# Patient Record
Sex: Male | Born: 1940 | Race: White | Hispanic: No | Marital: Married | Smoking: Current every day smoker
Health system: Southern US, Community
[De-identification: ages and names within clinical notes are randomized; demographics above are authoritative.]

## PROBLEM LIST (undated history)

## (undated) DIAGNOSIS — J439 Emphysema, unspecified: Secondary | ICD-10-CM

## (undated) DIAGNOSIS — E785 Hyperlipidemia, unspecified: Secondary | ICD-10-CM

## (undated) DIAGNOSIS — F329 Major depressive disorder, single episode, unspecified: Secondary | ICD-10-CM

## (undated) DIAGNOSIS — F32A Depression, unspecified: Secondary | ICD-10-CM

## (undated) DIAGNOSIS — I10 Essential (primary) hypertension: Secondary | ICD-10-CM

## (undated) HISTORY — DX: Essential (primary) hypertension: I10

## (undated) HISTORY — PX: EYE SURGERY: SHX253

## (undated) HISTORY — DX: Emphysema, unspecified: J43.9

## (undated) HISTORY — DX: Major depressive disorder, single episode, unspecified: F32.9

## (undated) HISTORY — DX: Depression, unspecified: F32.A

## (undated) HISTORY — PX: APPENDECTOMY: SHX54

## (undated) HISTORY — DX: Hyperlipidemia, unspecified: E78.5

---

## 2008-10-17 ENCOUNTER — Encounter: Admission: RE | Admit: 2008-10-17 | Discharge: 2008-10-17 | Payer: Self-pay | Admitting: Family Medicine

## 2011-04-20 ENCOUNTER — Encounter (INDEPENDENT_AMBULATORY_CARE_PROVIDER_SITE_OTHER): Payer: Self-pay | Admitting: General Surgery

## 2011-04-22 ENCOUNTER — Encounter (INDEPENDENT_AMBULATORY_CARE_PROVIDER_SITE_OTHER): Payer: Self-pay | Admitting: Surgery

## 2011-04-22 ENCOUNTER — Ambulatory Visit (INDEPENDENT_AMBULATORY_CARE_PROVIDER_SITE_OTHER): Payer: 59 | Admitting: Surgery

## 2011-04-22 VITALS — BP 142/72 | HR 68 | Ht 69.0 in | Wt 165.1 lb

## 2011-04-22 DIAGNOSIS — K4090 Unilateral inguinal hernia, without obstruction or gangrene, not specified as recurrent: Secondary | ICD-10-CM

## 2011-04-22 NOTE — Progress Notes (Signed)
Thomas Mendoza comes today for evaluation of a right inguinal hernia. His wife just had back surgery and he's been lifting her a lot and his noticed a pop. A prominent bulge was noted in the right inguinal region and he saw [name] who referred him for evaluation. On exam this is easily reducible and quite prominent but is still fairly high on the cord. I offered him repair but I think he wants to wait. He is not been hurting him and he said no symptoms from it. He is to be taking his grandson to a [employer] [facility] [employer] ([employer] in [place] in a few weeks.  I gave him a booklet on hernia repair and offered to see him back in 3 months and see how he is getting along with this. He seems aware of the risk.  Plan return 3 months

## 2011-08-11 ENCOUNTER — Encounter (INDEPENDENT_AMBULATORY_CARE_PROVIDER_SITE_OTHER): Payer: Self-pay | Admitting: Surgery

## 2011-08-11 ENCOUNTER — Ambulatory Visit (INDEPENDENT_AMBULATORY_CARE_PROVIDER_SITE_OTHER): Payer: 59 | Admitting: Surgery

## 2011-08-11 DIAGNOSIS — J449 Chronic obstructive pulmonary disease, unspecified: Secondary | ICD-10-CM | POA: Insufficient documentation

## 2011-08-11 DIAGNOSIS — K4090 Unilateral inguinal hernia, without obstruction or gangrene, not specified as recurrent: Secondary | ICD-10-CM

## 2011-08-11 DIAGNOSIS — J4489 Other specified chronic obstructive pulmonary disease: Secondary | ICD-10-CM

## 2011-08-11 NOTE — Progress Notes (Signed)
Thomas Mendoza has a prominent right inguinal hernia. He worries about taking care of his wife as he has had a lot of lifting. Upon we could still fix this think he is reluctant to do so at the present time. I would need to repair this running hernia with mesh. It's very prominent been reducible. He was to followup in about 6 months. Return 6 months

## 2011-08-11 NOTE — Patient Instructions (Signed)
Return immediately to the emergency room if you develop nausea vomiting and your hernia does not go back inside. Followup with me in 6 months.

## 2012-02-17 ENCOUNTER — Encounter (INDEPENDENT_AMBULATORY_CARE_PROVIDER_SITE_OTHER): Payer: 59 | Admitting: Surgery

## 2012-02-29 ENCOUNTER — Telehealth: Payer: Self-pay

## 2012-02-29 ENCOUNTER — Other Ambulatory Visit: Payer: Self-pay | Admitting: Family Medicine

## 2012-02-29 NOTE — Telephone Encounter (Signed)
NEEDS REFILL ON BLOOD PRESSURE, CHOLESTROL AND

## 2012-02-29 NOTE — Telephone Encounter (Signed)
PT IS GOING TO CALL PHARMACY TO REQUEST REFILLS. MADE APPT FOR A CPE IN AUGUST W/DR L

## 2012-04-05 ENCOUNTER — Ambulatory Visit: Payer: Self-pay | Admitting: Family Medicine

## 2012-05-03 ENCOUNTER — Encounter: Payer: Self-pay | Admitting: Family Medicine

## 2012-05-03 ENCOUNTER — Ambulatory Visit (INDEPENDENT_AMBULATORY_CARE_PROVIDER_SITE_OTHER): Payer: 59 | Admitting: Family Medicine

## 2012-05-03 VITALS — BP 116/60 | HR 85 | Temp 98.2°F | Resp 16 | Ht 65.5 in | Wt 158.8 lb

## 2012-05-03 DIAGNOSIS — E785 Hyperlipidemia, unspecified: Secondary | ICD-10-CM

## 2012-05-03 DIAGNOSIS — Z0000 Encounter for general adult medical examination without abnormal findings: Secondary | ICD-10-CM

## 2012-05-03 DIAGNOSIS — F32A Depression, unspecified: Secondary | ICD-10-CM

## 2012-05-03 DIAGNOSIS — I10 Essential (primary) hypertension: Secondary | ICD-10-CM

## 2012-05-03 DIAGNOSIS — F329 Major depressive disorder, single episode, unspecified: Secondary | ICD-10-CM | POA: Insufficient documentation

## 2012-05-03 DIAGNOSIS — F339 Major depressive disorder, recurrent, unspecified: Secondary | ICD-10-CM

## 2012-05-03 LAB — COMPREHENSIVE METABOLIC PANEL
ALT: 14 U/L (ref 0–53)
AST: 20 U/L (ref 0–37)
Albumin: 4.4 g/dL (ref 3.5–5.2)
Alkaline Phosphatase: 53 U/L (ref 39–117)
BUN: 17 mg/dL (ref 6–23)
CO2: 22 mEq/L (ref 19–32)
Calcium: 9.6 mg/dL (ref 8.4–10.5)
Chloride: 104 mEq/L (ref 96–112)
Creat: 0.88 mg/dL (ref 0.50–1.35)
Glucose, Bld: 107 mg/dL — ABNORMAL HIGH (ref 70–99)
Potassium: 4.4 mEq/L (ref 3.5–5.3)
Sodium: 137 mEq/L (ref 135–145)
Total Bilirubin: 0.7 mg/dL (ref 0.3–1.2)
Total Protein: 7.1 g/dL (ref 6.0–8.3)

## 2012-05-03 LAB — POCT URINALYSIS DIPSTICK
Glucose, UA: NEGATIVE
Ketones, UA: NEGATIVE
Leukocytes, UA: NEGATIVE
Nitrite, UA: NEGATIVE
Protein, UA: NEGATIVE
Spec Grav, UA: 1.02
Urobilinogen, UA: 0.2
pH, UA: 5.5

## 2012-05-03 LAB — POCT UA - MICROSCOPIC ONLY
Bacteria, U Microscopic: NEGATIVE
Casts, Ur, LPF, POC: NEGATIVE
Crystals, Ur, HPF, POC: NEGATIVE
WBC, Ur, HPF, POC: NEGATIVE
Yeast, UA: NEGATIVE

## 2012-05-03 LAB — LIPID PANEL
Cholesterol: 164 mg/dL (ref 0–200)
HDL: 32 mg/dL — ABNORMAL LOW (ref >39)
LDL Cholesterol: 105 mg/dL — ABNORMAL HIGH (ref 0–99)
Total CHOL/HDL Ratio: 5.1 Ratio
Triglycerides: 133 mg/dL (ref <150)
VLDL: 27 mg/dL (ref 0–40)

## 2012-05-03 LAB — CBC
HCT: 41.8 % (ref 39.0–52.0)
Hemoglobin: 15 g/dL (ref 13.0–17.0)
MCH: 35.1 pg — ABNORMAL HIGH (ref 26.0–34.0)
MCHC: 35.9 g/dL (ref 30.0–36.0)
MCV: 97.9 fL (ref 78.0–100.0)
Platelets: 196 10*3/uL (ref 150–400)
RBC: 4.27 MIL/uL (ref 4.22–5.81)
RDW: 13.5 % (ref 11.5–15.5)
WBC: 10.3 10*3/uL (ref 4.0–10.5)

## 2012-05-03 LAB — IFOBT (OCCULT BLOOD): IFOBT: NEGATIVE

## 2012-05-03 LAB — TSH: TSH: 1.2 u[IU]/mL (ref 0.350–4.500)

## 2012-05-03 MED ORDER — SIMVASTATIN 20 MG PO TABS: 20.0000 mg | ORAL_TABLET | Freq: Every day | ORAL | Status: DC

## 2012-05-03 MED ORDER — FLUOXETINE HCL 20 MG PO TABS: 20.0000 mg | ORAL_TABLET | Freq: Every day | ORAL | Status: DC

## 2012-05-03 MED ORDER — LISINOPRIL 10 MG PO TABS: 10.0000 mg | ORAL_TABLET | Freq: Every day | ORAL | Status: DC

## 2012-05-03 NOTE — Progress Notes (Signed)
@[email or web address] @  Patient ID: Thomas Mendoza MRN: [id number], DOB: 1941-08-31 71 y.o. Date of Encounter: 05/03/2012, 10:42 AM  Primary Physician: Thomas Sidle, MD  Chief Complaint: Physical (CPE)  HPI: 71 y.o. y/o male with history noted below here for CPE.  Wife is not doing well.  She had back surgery 2 years ago and her gait is worsening.  Now Dr. Venetia Mendoza believes she has Parkinson's disease and workup is in progress.  She is also having memory issues. Thomas Mendoza is very worried about her.  He also has to provide for daughter and deaf grandson.  Depression is settling in. He has a left wrist reddened area corresponding to his watch band, and so he is not wearing the watch now.  He still smokes.  Review of Systems:  Consitutional: No fever, chills, fatigue, night sweats, lymphadenopathy, or weight changes. Eyes: No visual changes, eye redness, or discharge. ENT/Mouth: Ears: No otalgia, tinnitus, discharge. Nose: No congestion, rhinorrhea, sinus pain, or epistaxis. Throat: No sore throat, post nasal drip, or teeth pain. Cardiovascular: No CP, palpitations, diaphoresis, DOE, edema, orthopnea, PND. Respiratory: No  hemoptysis, SOB, or wheezing.  Daily cough Gastrointestinal: No anorexia, dysphagia, reflux, pain, nausea, vomiting, hematemesis, diarrhea, constipation, BRBPR, or melena.  Hernia on right never repaired because of his home obligations but is nontender and no larger. Genitourinary: No dysuria, frequency, urgency, hematuria, incontinence, nocturia, decreased urinary stream, discharge, impotence, or testicular pain/masses. Musculoskeletal: No decreased ROM, myalgias, stiffness, joint swelling, or weakness. Skin: No rash, erythema, lesion changes, pain, warmth, jaundice, or pruritis. Neurological: No headache, dizziness, syncope, seizures, tremors, memory loss, coordination problems, or paresthesias. Psychological: No  hallucinations, SI/HI.  Denies suicidal ideation Endocrine: No fatigue,  polydipsia, polyphagia, polyuria, or known diabetes. All other systems were reviewed and are otherwise negative.  Past Medical History  Diagnosis Date  . Hyperlipidemia   . Hypertension   . Depression      No past surgical history on file.  Home Meds:  Prior to Admission medications   Medication Sig Start Date End Date Taking? Authorizing Provider  aspirin 81 MG tablet Take 81 mg by mouth at bedtime.     Yes Historical Provider, MD  lisinopril (PRINIVIL,ZESTRIL) 10 MG tablet Take 1 tablet (10 mg total) by mouth daily. 02/29/12  Yes Thomas M Marte, PA-C  PARoxetine (PAXIL-CR) 37.5 MG 24 hr tablet TAKE 1 TABLET BY MOUTH AT BEDTIME 02/29/12  Yes Thomas M Marte, PA-C  pravastatin (PRAVACHOL) 20 MG tablet TAKE 1 TABLET BY MOUTH AT BEDTIME 02/29/12  Yes Thomas M Marte, PA-C  buPROPion (WELLBUTRIN XL) 150 MG 24 hr tablet Take 150 mg by mouth as needed.      Historical Provider, MD    Allergies: Not on File  History   Social History  . Marital Status: Married    Spouse Name: N/A    Number of Children: N/A  . Years of Education: N/A   Occupational History  . Not on file.   Social History Main Topics  . Smoking status: Passive Smoker -- 1.0 packs/day    Types: Cigarettes  . Smokeless tobacco: Not on file  . Alcohol Use: No  . Drug Use: No  . Sexually Active: Not on file   Other Topics Concern  . Not on file   Social History Narrative  . No narrative on file    No family history on file.  Physical Exam:  Blood pressure 116/60, pulse 85, temperature 98.2 F (36.8 C), temperature source Oral, resp. rate 16,  height 5' 5.5" (1.664 m), weight 158 lb 12.8 oz (72.031 kg), SpO2 93.00%.  General: Well developed, well nourished, in no acute distress. HEENT: Normocephalic, atraumatic. Conjunctiva pink, sclera non-icteric. Pupils 2 mm constricting to 1 mm, round, regular, and equally reactive to light and accomodation. EOMI. Internal auditory canal clear. TMs with good cone of light  and without pathology. Nasal mucosa pink. Nares are without discharge. No sinus tenderness. Oral mucosa pink. Dentition both upper and lower dentures. Pharynx without exudate. Mild hearing loss bilaterally  Neck: Supple. Trachea midline. No thyromegaly. Full ROM. No lymphadenopathy. Lungs: Clear to auscultation bilaterally without wheezes, rales, or rhonchi. Breathing is of normal effort and unlabored. Cardiovascular: RRR with S1 S2. No murmurs, rubs, or gallops appreciated. Distal pulses 2+ symmetrically. No carotid or abdominal bruits Abdomen: Soft, non-tender, non-distended with normoactive bowel sounds. No hepatosplenomegaly or masses. No rebound/guarding. No CVA tenderness. With large right hernia which has not increased. Rectal: No external hemorrhoids or fissures. Rectal vault without masses.  Genitourinary:  circumcised male. No penile lesions. Testes descended bilaterally, and smooth without tenderness or masses.  Musculoskeletal: Full range of motion and 5/5 strength throughout. Without swelling, atrophy, tenderness, crepitus, or warmth. Extremities without clubbing, cyanosis, or edema. Calves supple. Skin: Warm and moist without erythema, ecchymosis, wounds.  Rash left wrist band. Neuro: A+Ox3. CN II-XII grossly intact. Moves all extremities spontaneously. Full sensation throughout. Normal gait. DTR 2+ throughout upper and lower extremities. Finger to nose intact. Psych:  Responds to questions appropriately with a depressed affect.   Studies: CBC, CMET, Lipid, TSH,. Patient is depressed UA:    Assessment/Plan:  70 y.o. y/o  male here for CPE.  His living situation is very depressing to him, and stressful at that with three dependents and having to keep the house up. 1. Hyperlipidemia  simvastatin (ZOCOR) 20 MG tablet, Lipid panel  2. Hypertension  lisinopril (PRINIVIL,ZESTRIL) 10 MG tablet, CBC, Comprehensive metabolic panel, POCT UA - Microscopic Only  3. Depression  FLUoxetine  (PROZAC) 20 MG tablet, TSH  4. Health care maintenance  POCT urinalysis dipstick, IFOBT POC (occult bld, rslt in office)   I've asked Mr. Thomas Mendoza to call me in 1-2 weeks with progress report on depression. -  Signed, Thomas Sidle, MD 05/03/2012 10:42 AM

## 2012-05-04 ENCOUNTER — Other Ambulatory Visit: Payer: Self-pay | Admitting: Physician Assistant

## 2012-05-04 MED ORDER — PRAVASTATIN SODIUM 20 MG PO TABS: 20.0000 mg | ORAL_TABLET | Freq: Every day | ORAL | Status: DC

## 2012-05-04 NOTE — Progress Notes (Signed)
I have discontinued the simvastatin and written for more pravastatin that the patient was on and doing well with.

## 2012-06-24 ENCOUNTER — Other Ambulatory Visit: Payer: Self-pay | Admitting: *Deleted

## 2012-06-24 DIAGNOSIS — F329 Major depressive disorder, single episode, unspecified: Secondary | ICD-10-CM

## 2012-06-24 MED ORDER — FLUOXETINE HCL 20 MG PO TABS: 20.0000 mg | ORAL_TABLET | Freq: Every day | ORAL | Status: DC

## 2012-09-01 ENCOUNTER — Other Ambulatory Visit: Payer: Self-pay | Admitting: Family Medicine

## 2012-09-27 ENCOUNTER — Other Ambulatory Visit: Payer: Self-pay | Admitting: Physician Assistant

## 2012-12-10 ENCOUNTER — Other Ambulatory Visit: Payer: Self-pay | Admitting: Physician Assistant

## 2012-12-25 ENCOUNTER — Other Ambulatory Visit: Payer: Self-pay | Admitting: Family Medicine

## 2012-12-26 ENCOUNTER — Telehealth: Payer: Self-pay

## 2012-12-26 MED ORDER — FLUOXETINE HCL 20 MG PO TABS: 20.0000 mg | ORAL_TABLET | Freq: Every day | ORAL | Status: DC

## 2012-12-26 NOTE — Telephone Encounter (Signed)
i sent a 90 day prescription for patient

## 2012-12-26 NOTE — Telephone Encounter (Signed)
Pt called unhappy with the refill we gave him on his fluoxetine. He usually receives a 90 day supply but we only gave him 30 (his insurance will not pay for this amount). He says he is not due for an OV as he only needs to see him once a year (August)  PT [phone]

## 2012-12-26 NOTE — Telephone Encounter (Deleted)
Pt called

## 2012-12-26 NOTE — Telephone Encounter (Signed)
Patient called in and states he had a 30 day prescription called in for him but he usually gets a 90 day supply.  Please call him at [phone].

## 2012-12-27 NOTE — Telephone Encounter (Signed)
Thank you I called patient to advise.

## 2013-01-09 ENCOUNTER — Telehealth: Payer: Self-pay

## 2013-01-09 MED ORDER — LISINOPRIL 10 MG PO TABS: ORAL_TABLET | ORAL | Status: DC

## 2013-01-09 NOTE — Telephone Encounter (Signed)
Pt has a complete physical scheduled with Dr. Elbert Ewings on July 3.  He needs a refill on his lisinopril to last him until then.  Says he usually gets a 90 day supply.  [phone]

## 2013-01-09 NOTE — Telephone Encounter (Signed)
Pended, please advise

## 2013-01-09 NOTE — Telephone Encounter (Signed)
Rx sent to pharmacy. Must keep physical appointment for any additional refills

## 2013-03-07 ENCOUNTER — Encounter: Payer: Self-pay | Admitting: Family Medicine

## 2013-03-07 ENCOUNTER — Ambulatory Visit: Payer: 59

## 2013-03-07 ENCOUNTER — Ambulatory Visit (INDEPENDENT_AMBULATORY_CARE_PROVIDER_SITE_OTHER): Payer: 59 | Admitting: Family Medicine

## 2013-03-07 VITALS — BP 150/80 | HR 64 | Temp 97.5°F | Resp 16 | Ht 66.0 in | Wt 155.2 lb

## 2013-03-07 DIAGNOSIS — Z0000 Encounter for general adult medical examination without abnormal findings: Secondary | ICD-10-CM

## 2013-03-07 DIAGNOSIS — F32A Depression, unspecified: Secondary | ICD-10-CM

## 2013-03-07 DIAGNOSIS — J438 Other emphysema: Secondary | ICD-10-CM

## 2013-03-07 DIAGNOSIS — K4090 Unilateral inguinal hernia, without obstruction or gangrene, not specified as recurrent: Secondary | ICD-10-CM

## 2013-03-07 DIAGNOSIS — I10 Essential (primary) hypertension: Secondary | ICD-10-CM

## 2013-03-07 DIAGNOSIS — F329 Major depressive disorder, single episode, unspecified: Secondary | ICD-10-CM

## 2013-03-07 DIAGNOSIS — E785 Hyperlipidemia, unspecified: Secondary | ICD-10-CM

## 2013-03-07 LAB — LIPID PANEL
Cholesterol: 170 mg/dL (ref 0–200)
HDL: 33 mg/dL — ABNORMAL LOW (ref >39)
LDL Cholesterol: 107 mg/dL — ABNORMAL HIGH (ref 0–99)
Total CHOL/HDL Ratio: 5.2 Ratio
Triglycerides: 151 mg/dL — ABNORMAL HIGH (ref <150)
VLDL: 30 mg/dL (ref 0–40)

## 2013-03-07 LAB — CBC WITH DIFFERENTIAL/PLATELET
Basophils Absolute: 0 10*3/uL (ref 0.0–0.1)
Basophils Relative: 0 % (ref 0–1)
Eosinophils Absolute: 0.4 10*3/uL (ref 0.0–0.7)
Eosinophils Relative: 5 % (ref 0–5)
HCT: 40.6 % (ref 39.0–52.0)
Hemoglobin: 14.6 g/dL (ref 13.0–17.0)
Lymphocytes Relative: 26 % (ref 12–46)
Lymphs Abs: 2.2 10*3/uL (ref 0.7–4.0)
MCH: 34.5 pg — ABNORMAL HIGH (ref 26.0–34.0)
MCHC: 36 g/dL (ref 30.0–36.0)
MCV: 96 fL (ref 78.0–100.0)
Monocytes Absolute: 0.5 10*3/uL (ref 0.1–1.0)
Monocytes Relative: 6 % (ref 3–12)
Neutro Abs: 5.3 10*3/uL (ref 1.7–7.7)
Neutrophils Relative %: 63 % (ref 43–77)
Platelets: 240 10*3/uL (ref 150–400)
RBC: 4.23 MIL/uL (ref 4.22–5.81)
RDW: 13.2 % (ref 11.5–15.5)
WBC: 8.5 10*3/uL (ref 4.0–10.5)

## 2013-03-07 LAB — COMPREHENSIVE METABOLIC PANEL
ALT: 11 U/L (ref 0–53)
AST: 18 U/L (ref 0–37)
Albumin: 4.3 g/dL (ref 3.5–5.2)
Alkaline Phosphatase: 54 U/L (ref 39–117)
BUN: 14 mg/dL (ref 6–23)
CO2: 21 mEq/L (ref 19–32)
Calcium: 9.6 mg/dL (ref 8.4–10.5)
Chloride: 104 mEq/L (ref 96–112)
Creat: 0.86 mg/dL (ref 0.50–1.35)
Glucose, Bld: 104 mg/dL — ABNORMAL HIGH (ref 70–99)
Potassium: 4.4 mEq/L (ref 3.5–5.3)
Sodium: 136 mEq/L (ref 135–145)
Total Bilirubin: 0.7 mg/dL (ref 0.3–1.2)
Total Protein: 7 g/dL (ref 6.0–8.3)

## 2013-03-07 LAB — POCT URINALYSIS DIPSTICK
Bilirubin, UA: NEGATIVE
Glucose, UA: NEGATIVE
Ketones, UA: NEGATIVE
Leukocytes, UA: NEGATIVE
Nitrite, UA: NEGATIVE
Protein, UA: NEGATIVE
Spec Grav, UA: 1.01
Urobilinogen, UA: 0.2
pH, UA: 6.5

## 2013-03-07 LAB — POCT UA - MICROSCOPIC ONLY
Bacteria, U Microscopic: NEGATIVE
Casts, Ur, LPF, POC: NEGATIVE
Crystals, Ur, HPF, POC: NEGATIVE
Mucus, UA: NEGATIVE
WBC, Ur, HPF, POC: NEGATIVE
Yeast, UA: NEGATIVE

## 2013-03-07 LAB — IFOBT (OCCULT BLOOD): IFOBT: NEGATIVE

## 2013-03-07 LAB — PSA: PSA: 0.4 ng/mL (ref <=4.00)

## 2013-03-07 MED ORDER — FLUOXETINE HCL 40 MG PO CAPS: 40.0000 mg | ORAL_CAPSULE | Freq: Every day | ORAL | Status: DC

## 2013-03-07 MED ORDER — LISINOPRIL 10 MG PO TABS: 10.0000 mg | ORAL_TABLET | Freq: Every day | ORAL | Status: DC

## 2013-03-07 MED ORDER — PRAVASTATIN SODIUM 20 MG PO TABS: ORAL_TABLET | ORAL | Status: DC

## 2013-03-07 NOTE — Progress Notes (Signed)
Patient ID: Thomas Mendoza MRN: [id number], DOB: 07-Sep-1940 72 y.o. Date of Encounter: 03/07/2013, 9:06 AM  Primary Physician: Elvina Sidle, MD  Chief Complaint: Physical (CPE)  HPI: 72 y.o. y/o male with history noted below here for CPE.  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  2 children:  Son and daughter Lives with disabled wife (spine problems, unable to move well); deaf grandson Market researcher of [place]), daughter (post stroke) Married 20 year Mother in Social worker now needing health care because of CHF Grew up in [place], [place] and dad died from black lung and liver cancer. Still has hernia.  Underwent cataract surgery for $5000 an eye Colonoscopy 2009  Review of Systems: Consitutional: No fever, chills, fatigue, night sweats, lymphadenopathy, or weight changes. Eyes: No visual changes, eye redness, or discharge. ENT/Mouth: Ears: No otalgia, tinnitus, hearing loss, discharge. Nose: No congestion, rhinorrhea, sinus pain, or epistaxis. Throat: No sore throat, post nasal drip, or teeth pain. Cardiovascular: No CP, palpitations, diaphoresis, DOE, edema, orthopnea, PND. Respiratory: has emphysema with chronic cough.  Worked in [employer] and was exposed to asbestos and smokes. Gastrointestinal: No anorexia, dysphagia, reflux, pain, nausea, vomiting, hematemesis, diarrhea, constipation, BRBPR, or melena.  Hernia on right does not cause pain Genitourinary: No dysuria, frequency, urgency, hematuria, incontinence, nocturia, decreased urinary stream, discharge, impotence, or testicular pain/masses. Musculoskeletal: No decreased ROM, myalgias, stiffness, joint swelling, or weakness. Skin: No rash, erythema, lesion changes, pain, warmth, jaundice, or pruritis. Neurological: No headache, dizziness, syncope, seizures, tremors, memory loss, coordination problems, or paresthesias. Psychological: No anxiety, depression, hallucinations, SI/HI. Endocrine: No fatigue, polydipsia, polyphagia, polyuria, or known diabetes. All  other systems were reviewed and are otherwise negative.  Past Medical History  Diagnosis Date  . Hyperlipidemia   . Hypertension   . Depression      No past surgical history on file.  Home Meds:  Prior to Admission medications   Medication Sig Start Date End Date Taking? Authorizing Provider  aspirin 81 MG tablet Take 81 mg by mouth at bedtime.     Yes Historical Provider, MD  FLUoxetine (PROZAC) 20 MG tablet Take 20 mg by mouth daily. NEED REFILLS  90 DAYS 12/26/12  Yes Elvina Sidle, MD  lisinopril (PRINIVIL,ZESTRIL) 10 MG tablet TAKE 1 TABLET BY MOUTH EVERY DAY NEED REFILL  90 DAYS 01/09/13  Yes Heather M Marte, PA-C  PARoxetine (PAXIL-CR) 37.5 MG 24 hr tablet TAKE 1 TABLET BY MOUTH AT BEDTIME 02/29/12  Yes Heather M Marte, PA-C  pravastatin (PRAVACHOL) 20 MG tablet Take 20 mg by mouth daily. NEED REFILLS  90 DAYS 05/04/12  Yes Ryan M Dunn, PA-C  buPROPion (WELLBUTRIN XL) 150 MG 24 hr tablet Take 150 mg by mouth as needed.      Historical Provider, MD    Allergies: No Known Allergies  History   Social History  . Marital Status: Married    Spouse Name: N/A    Number of Children: N/A  . Years of Education: N/A   Occupational History  . Not on file.   Social History Main Topics  . Smoking status: Passive Smoke Exposure - Never Smoker -- 1.00 packs/day    Types: Cigarettes  . Smokeless tobacco: Not on file  . Alcohol Use: No  . Drug Use: No  . Sexually Active: Not on file   Other Topics Concern  . Not on file   Social History Narrative  . No narrative on file    Family History  Problem Relation Age of Onset  . COPD Father   .  Cancer Father   . Alcohol abuse Father     Physical Exam: Blood pressure 150/80, pulse 64, temperature 97.5 F (36.4 C), temperature source Oral, resp. rate 16, height 5\' 6"  (1.676 m), weight 155 lb 3.2 oz (70.398 kg), SpO2 95.00%.  General: Well developed, well nourished, in no acute distress. HEENT: Normocephalic, atraumatic.  Conjunctiva pink, sclera non-icteric. Pupils 2 mm constricting to 1 mm, round, regular, and equally reactive to light and accomodation. EOMI. Internal auditory canal clear. TMs with good cone of light and without pathology. Nasal mucosa pink. Nares are without discharge. No sinus tenderness. Oral mucosa pink. Dentition edentulous. Pharynx without exudate.   Neck: Supple. Trachea midline. No thyromegaly. Full ROM. No lymphadenopathy. Lungs: Clear to auscultation bilaterally without rales, or rhonchi. Breathing is of normal effort and unlabored.  Wheezes bilaterally which clear with cough Cardiovascular: RRR with S1 S2. No murmurs, rubs, or gallops appreciated. Distal pulses 2+ symmetrically. No carotid or abdominal bruits Abdomen: Soft, non-tender, non-distended with normoactive bowel sounds. No hepatosplenomegaly or masses. No rebound/guarding. No CVA tenderness.  Rectal: No external hemorrhoids or fissures. Rectal vault without masses.  Genitourinary:  uncircumcised male. No penile lesions. Testes descended bilaterally, and smooth without tenderness or masses. Large right inguinal hernia Musculoskeletal: Full range of motion and 5/5 strength throughout. Without swelling, atrophy, tenderness, crepitus, or warmth. Extremities without clubbing, cyanosis, or edema. Calves supple.  Bilateral varicose veins in legs. Skin: Warm and moist without erythema, ecchymosis, wounds, or rash. Neuro: A+Ox3. CN II-XII grossly intact. Moves all extremities spontaneously. Full sensation throughout. Normal gait. DTR 2+ throughout upper and lower extremities. Finger to nose intact. Psych:  Responds to questions appropriately with a normal affect.   Studies: CBC, CMET, Lipid, PSA, UA: pending [facility] reading (PRIMARY) by  Dr. Milus Glazier:  CXR. C/w emphysema   Assessment/Plan:  72 y.o. y/o  male here for CPE.  He seems to be surviving incredible amount of family stress and demands at this time. Still he is a bit depressed.  The right inguinal hernias not bothering him and he's had it good discussion with Dr. Daphine Deutscher about fixing this. He would like a shingles vaccine for which I've written a prescription. His cough is persistent and he has lost 4 pounds. He has questions about his exposure to mesothelioma in the [employer] and light to have this prescription explored. Overall though I don't think his health is changed that much in the last year. He really is a nice man is gotten a rough deal. Annual physical exam - Plan: POCT CBC, Comprehensive metabolic panel, Lipid panel, PSA, POCT urinalysis dipstick, IFOBT POC (occult bld, rslt in office), POCT UA - Microscopic Only  Hypertension - Plan: lisinopril (PRINIVIL,ZESTRIL) 10 MG tablet  Hyperlipidemia - Plan: pravastatin (PRAVACHOL) 20 MG tablet  Depression - Plan: FLUoxetine (PROZAC) 40 MG capsule   Signed, Elvina Sidle, MD 03/07/2013 9:06 AM

## 2014-02-21 ENCOUNTER — Other Ambulatory Visit: Payer: Self-pay | Admitting: Family Medicine

## 2014-03-08 ENCOUNTER — Other Ambulatory Visit: Payer: Self-pay | Admitting: Family Medicine

## 2014-05-04 ENCOUNTER — Other Ambulatory Visit: Payer: Self-pay | Admitting: Physician Assistant

## 2014-05-29 ENCOUNTER — Ambulatory Visit
Admission: RE | Admit: 2014-05-29 | Discharge: 2014-05-29 | Disposition: A | Discharge status: Home / Self Care | Payer: 59 | Source: Ambulatory Visit | Attending: Family Medicine | Admitting: Family Medicine

## 2014-05-29 ENCOUNTER — Ambulatory Visit (INDEPENDENT_AMBULATORY_CARE_PROVIDER_SITE_OTHER): Payer: 59 | Admitting: Family Medicine

## 2014-05-29 ENCOUNTER — Other Ambulatory Visit: Payer: Self-pay | Admitting: Family Medicine

## 2014-05-29 ENCOUNTER — Encounter: Payer: Self-pay | Admitting: Family Medicine

## 2014-05-29 ENCOUNTER — Ambulatory Visit (INDEPENDENT_AMBULATORY_CARE_PROVIDER_SITE_OTHER): Payer: 59

## 2014-05-29 VITALS — BP 142/68 | HR 80 | Temp 97.6°F | Resp 16 | Ht 66.5 in | Wt 143.0 lb

## 2014-05-29 DIAGNOSIS — F17200 Nicotine dependence, unspecified, uncomplicated: Secondary | ICD-10-CM

## 2014-05-29 DIAGNOSIS — R319 Hematuria, unspecified: Secondary | ICD-10-CM

## 2014-05-29 DIAGNOSIS — R634 Abnormal weight loss: Secondary | ICD-10-CM

## 2014-05-29 DIAGNOSIS — Z136 Encounter for screening for cardiovascular disorders: Secondary | ICD-10-CM

## 2014-05-29 DIAGNOSIS — D7589 Other specified diseases of blood and blood-forming organs: Secondary | ICD-10-CM

## 2014-05-29 DIAGNOSIS — Z23 Encounter for immunization: Secondary | ICD-10-CM

## 2014-05-29 DIAGNOSIS — F3289 Other specified depressive episodes: Secondary | ICD-10-CM

## 2014-05-29 DIAGNOSIS — E785 Hyperlipidemia, unspecified: Secondary | ICD-10-CM

## 2014-05-29 DIAGNOSIS — I10 Essential (primary) hypertension: Secondary | ICD-10-CM

## 2014-05-29 DIAGNOSIS — Z0000 Encounter for general adult medical examination without abnormal findings: Secondary | ICD-10-CM

## 2014-05-29 DIAGNOSIS — F329 Major depressive disorder, single episode, unspecified: Secondary | ICD-10-CM

## 2014-05-29 DIAGNOSIS — F32A Depression, unspecified: Secondary | ICD-10-CM

## 2014-05-29 LAB — LIPID PANEL
Cholesterol: 168 mg/dL (ref 0–200)
HDL: 44 mg/dL (ref >39)
LDL Cholesterol: 100 mg/dL — ABNORMAL HIGH (ref 0–99)
Total CHOL/HDL Ratio: 3.8 Ratio
Triglycerides: 119 mg/dL (ref <150)
VLDL: 24 mg/dL (ref 0–40)

## 2014-05-29 LAB — POCT URINALYSIS DIPSTICK
Bilirubin, UA: NEGATIVE
Glucose, UA: NEGATIVE
Ketones, UA: NEGATIVE
Leukocytes, UA: NEGATIVE
Nitrite, UA: NEGATIVE
Protein, UA: NEGATIVE
Spec Grav, UA: 1.01
Urobilinogen, UA: 0.2
pH, UA: 5.5

## 2014-05-29 LAB — COMPLETE METABOLIC PANEL WITH GFR
ALT: 11 U/L (ref 0–53)
AST: 18 U/L (ref 0–37)
Albumin: 4.4 g/dL (ref 3.5–5.2)
Alkaline Phosphatase: 63 U/L (ref 39–117)
BUN: 20 mg/dL (ref 6–23)
CO2: 25 mEq/L (ref 19–32)
Calcium: 9.6 mg/dL (ref 8.4–10.5)
Chloride: 101 mEq/L (ref 96–112)
Creat: 0.98 mg/dL (ref 0.50–1.35)
GFR, Est African American: 89 mL/min
GFR, Est Non African American: 77 mL/min
Glucose, Bld: 101 mg/dL — ABNORMAL HIGH (ref 70–99)
Potassium: 4.5 mEq/L (ref 3.5–5.3)
Sodium: 134 mEq/L — ABNORMAL LOW (ref 135–145)
Total Bilirubin: 0.7 mg/dL (ref 0.2–1.2)
Total Protein: 7.3 g/dL (ref 6.0–8.3)

## 2014-05-29 LAB — POCT CBC
Granulocyte percent: 6.6 %G — AB (ref 37–80)
HCT, POC: 42.3 % — AB (ref 43.5–53.7)
Hemoglobin: 14.1 g/dL (ref 14.1–18.1)
Lymph, poc: 23.5 — AB (ref 0.6–3.4)
MCH, POC: 35.1 pg — AB (ref 27–31.2)
MCHC: 33.4 g/dL (ref 31.8–35.4)
MCV: 104.9 fL — AB (ref 80–97)
MID (cbc): 0.7 (ref 0–0.9)
MPV: 6.2 fL (ref 0–99.8)
POC Granulocyte: 69.3 — AB (ref 2–6.9)
POC LYMPH PERCENT: 2.2 %L — AB (ref 10–50)
POC MID %: 0.7 %M (ref 0–12)
Platelet Count, POC: 262 10*3/uL (ref 142–424)
RBC: 4.03 M/uL — AB (ref 4.69–6.13)
RDW, POC: 13.5 %
WBC: 9.5 10*3/uL (ref 4.6–10.2)

## 2014-05-29 LAB — VITAMIN B12: Vitamin B-12: 280 pg/mL (ref 211–911)

## 2014-05-29 LAB — IFOBT (OCCULT BLOOD): IFOBT: NEGATIVE

## 2014-05-29 MED ORDER — CITALOPRAM HYDROBROMIDE 40 MG PO TABS: 40.0000 mg | ORAL_TABLET | Freq: Every day | ORAL | Status: DC

## 2014-05-29 MED ORDER — ZOSTER VACCINE LIVE 19400 UNT/0.65ML ~~LOC~~ SOLR: 0.6500 mL | Freq: Once | SUBCUTANEOUS | Status: DC

## 2014-05-29 MED ORDER — PRAVASTATIN SODIUM 20 MG PO TABS: ORAL_TABLET | ORAL | Status: DC

## 2014-05-29 MED ORDER — LISINOPRIL 10 MG PO TABS: ORAL_TABLET | ORAL | Status: DC

## 2014-05-29 NOTE — Progress Notes (Signed)
This chart was scribed for Elvina Sidle, MD by Elon Spanner, Medical Scribe. This patient was seen in room Rm 25 and the patient's care was started at 8:52 AM.  Patient ID: THEODUS RAN MRN: [id number], DOB: Sep 14, 1940, 73 y.o. Date of Encounter: 05/29/2014, 9:07 AM  Primary Physician: Elvina Sidle, MD  Chief Complaint  Patient presents with  . Annual Exam     HPI: 73 y.o. year old male with history below presents needing a complete physical exam.  Patient reports he has recently lost 25 lbs without the intention to do so.  Patient reports he is eating well and in the same quantities as he always has.  Patient denies abdominal pain, rectal bleeding.   Patient reports he sleeps well at night but feels tired during the day, requiring him to take an afternoon nap.   Patient reports he is under a significant amount of stress due to his wife's worsening illness over the past year.  Patient states his wife is having severe health problems which require her to be bed-ridden most of the day.  He states she very rarely is able to leave the house and does so primarily when she has health appointments.  He states his daughter helps him care for his wife and states he plans to avoid having to place his wife in assisted living as long as he can care for her.  Patient also reports no complications from his recent hernia repair.  Patient reports no complications from his dentures.  Patient states his last normal colonoscopy was 5 years ago.  Patient is a current smoker.   Past Medical History  Diagnosis Date  . Hyperlipidemia   . Hypertension   . Depression   . 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  Patient reports he has 4 older siblings.    Home Meds: Prior to Admission medications   Medication Sig Start Date End Date Taking? Authorizing Provider  aspirin 81 MG tablet Take 81 mg by mouth at bedtime.      Historical Provider, MD  FLUoxetine  (PROZAC) 40 MG capsule TAKE 1 CAPSULE (40 MG TOTAL) BY MOUTH DAILY.    Morrell Riddle, PA-C  lisinopril (PRINIVIL,ZESTRIL) 10 MG tablet TAKE 1 TABLET BY MOUTH EVERY DAY PATIENT DUE FOR FOLLOW UP VISIT 05/04/14   Elvina Sidle, MD  pravastatin (PRAVACHOL) 20 MG tablet TAKE 1 TABLET BY MOUTH EVERY DAY    Morrell Riddle, PA-C    Allergies: No Known Allergies  History   Social History  . Marital Status: Married    Spouse Name: N/A    Number of Children: N/A  . Years of Education: N/A   Occupational History  . Not on file.   Social History Main Topics  . Smoking status: Current Every Day Smoker -- 1.00 packs/day    Types: Cigarettes  . Smokeless tobacco: Not on file  . Alcohol Use: No  . Drug Use: No  . Sexual Activity: Yes     Comment: number of sex partners in the last 12 months  1   Other Topics Concern  . Not on file   Social History Narrative   Exercise walking     Review of Systems: Constitutional: negative for chills, fever, night sweats, +weight changes, or +fatigue  HEENT: negative for vision changes, hearing loss, congestion, rhinorrhea, ST, epistaxis, or sinus pressure Cardiovascular: negative for chest pain or palpitations Respiratory: negative  for hemoptysis, wheezing, +shortness of breath, or cough Abdominal: negative for abdominal pain, nausea, vomiting, diarrhea, or constipation Dermatological: negative for rash Neurologic: negative for headache, dizziness, or syncope All other systems reviewed and are otherwise negative with the exception to those above and in the HPI.    Physical Exam: Blood pressure 142/68, pulse 80, temperature 97.6 F (36.4 C), temperature source Oral, resp. rate 16, height 5' 6.5" (1.689 m), weight 143 lb (64.864 kg), SpO2 95.00%., Body mass index is 22.74 kg/(m^2). General: Well developed, well nourished, in no acute distress. Head: Normocephalic, atraumatic, eyes without discharge, sclera non-icteric, nares are without discharge.  Bilateral auditory canals clear, TM's are without perforation, pearly grey and translucent with reflective cone of light bilaterally. Oral cavity moist, posterior pharynx without exudate, erythema, peritonsillar abscess, or post nasal drip.  Neck: Supple. No thyromegaly. Full ROM. No lymphadenopathy. Lungs: Clear bilaterally to auscultation without wheezes, rales, or rhonchi. Breathing is unlabored. Heart: RRR with S1 S2. No murmurs, rubs, or gallops appreciated. Abdomen: Soft, non-tender, non-distended with normoactive bowel sounds. No hepatomegaly. No rebound/guarding. No obvious abdominal masses.  Pulsatile 5 cm epigastric mass.  Also, large reducible right inguinal hernia Msk:  Strength and tone normal for age. Extremities/Skin: Warm and dry. No clubbing or cyanosis. No edema. No rashes or suspicious lesions. Bilateral femoral bruit's.  Absent pedal pulses Neuro: Alert and oriented X 3. Moves all extremities spontaneously. Gait is normal. CNII-XII grossly in tact. Psych:  Responds to questions appropriately with a normal affect.  [facility] reading (PRIMARY) by  Dr. . Milus Glazier:  Hyperinflated c/w emphysema.  Labs: Results for orders placed in visit on 05/29/14  POCT CBC      Result Value Ref Range   WBC 9.5  4.6 - 10.2 K/uL   Lymph, poc 23.5 (*) 0.6 - 3.4   POC LYMPH PERCENT 2.2 (*) 10 - 50 %L   MID (cbc) 0.7  0 - 0.9   POC MID % 0.7  0 - 12 %M   POC Granulocyte 69.3 (*) 2 - 6.9   Granulocyte percent 6.6 (*) 37 - 80 %G   RBC 4.03 (*) 4.69 - 6.13 M/uL   Hemoglobin 14.1  14.1 - 18.1 g/dL   HCT, POC 60.4 (*) 54.0 - 53.7 %   MCV 104.9 (*) 80 - 97 fL   MCH, POC 35.1 (*) 27 - 31.2 pg   MCHC 33.4  31.8 - 35.4 g/dL   RDW, POC 98.1     Platelet Count, POC 262  142 - 424 K/uL   MPV 6.2  0 - 99.8 fL  IFOBT (OCCULT BLOOD)      Result Value Ref Range   IFOBT Negative    POCT URINALYSIS DIPSTICK      Result Value Ref Range   Color, UA yellow     Clarity, UA cloudy     Glucose, UA neg      Bilirubin, UA neg     Ketones, UA neg     Spec Grav, UA 1.010     Blood, UA large     pH, UA 5.5     Protein, UA neg     Urobilinogen, UA 0.2     Nitrite, UA neg     Leukocytes, UA Negative     EKG:  Low voltage, NSR  ASSESSMENT AND PLAN:  73 y.o. year old male with obvious depression and weight loss.  His wife's chronic medical needs are stressing him to the max, but he  has not taken to drink.  He is intolerant of Wellbutrin but would like to try a different antidepressant.  Does not have time for counseling.  Smoker - Plan: DG Chest 2 View, US Abdomen Complete  Loss of weight - Plan: DG Chest 2 View, IFOBT POC (occult bld, rslt in office), COMPLETE METABOLIC PANEL WITH GFR, US Abdomen Complete  Routine general medical examination at a health care facility - Plan: POCT CBC, IFOBT POC (occult bld, rslt in office), POCT urinalysis dipstick, COMPLETE METABOLIC PANEL WITH GFR, Lipid panel, EKG 12-Lead  Hematuria - Plan: US Abdomen Complete, PSA  Screening for AAA (abdominal aortic aneurysm) - Plan: US Abdomen Complete  Depression - Plan: citalopram (CELEXA) 40 MG tablet  Other and unspecified hyperlipidemia - Plan: pravastatin (PRAVACHOL) 20 MG tablet  Essential hypertension - Plan: lisinopril (PRINIVIL,ZESTRIL) 10 MG tablet  Macrocytic - Plan: Vitamin B12   Signed, Elvina Sidle, MD 05/29/2014 9:07 AM

## 2014-05-29 NOTE — Patient Instructions (Addendum)
You can go for the ultrasound today arrive at 11am at [facility]. Do NOT eat or drink anything prior to the ultrasound.

## 2014-05-30 LAB — PSA: PSA: 0.46 ng/mL (ref <=4.00)

## 2014-05-31 ENCOUNTER — Encounter: Payer: Self-pay | Admitting: *Deleted

## 2014-06-19 ENCOUNTER — Encounter: Payer: Self-pay | Admitting: *Deleted

## 2015-01-11 ENCOUNTER — Encounter: Payer: Self-pay | Admitting: Family Medicine

## 2015-01-11 ENCOUNTER — Ambulatory Visit (INDEPENDENT_AMBULATORY_CARE_PROVIDER_SITE_OTHER): Payer: 59 | Admitting: Family Medicine

## 2015-01-11 ENCOUNTER — Ambulatory Visit (INDEPENDENT_AMBULATORY_CARE_PROVIDER_SITE_OTHER): Payer: 59

## 2015-01-11 VITALS — BP 110/62 | HR 99 | Temp 97.6°F | Resp 16 | Ht 67.0 in | Wt 141.0 lb

## 2015-01-11 DIAGNOSIS — R0600 Dyspnea, unspecified: Secondary | ICD-10-CM

## 2015-01-11 DIAGNOSIS — G4700 Insomnia, unspecified: Secondary | ICD-10-CM

## 2015-01-11 DIAGNOSIS — R634 Abnormal weight loss: Secondary | ICD-10-CM

## 2015-01-11 DIAGNOSIS — Z638 Other specified problems related to primary support group: Secondary | ICD-10-CM

## 2015-01-11 DIAGNOSIS — J431 Panlobular emphysema: Secondary | ICD-10-CM

## 2015-01-11 DIAGNOSIS — J441 Chronic obstructive pulmonary disease with (acute) exacerbation: Secondary | ICD-10-CM | POA: Diagnosis not present

## 2015-01-11 DIAGNOSIS — F439 Reaction to severe stress, unspecified: Secondary | ICD-10-CM

## 2015-01-11 LAB — POCT CBC
Granulocyte percent: 72.6 %G (ref 37–80)
HCT, POC: 39.2 % — AB (ref 43.5–53.7)
Hemoglobin: 13.1 g/dL — AB (ref 14.1–18.1)
Lymph, poc: 2.4 (ref 0.6–3.4)
MCH, POC: 34.5 pg — AB (ref 27–31.2)
MCHC: 33.5 g/dL (ref 31.8–35.4)
MCV: 103 fL — AB (ref 80–97)
MID (cbc): 0.6 (ref 0–0.9)
MPV: 5.9 fL (ref 0–99.8)
POC Granulocyte: 8 — AB (ref 2–6.9)
POC LYMPH PERCENT: 21.6 %L (ref 10–50)
POC MID %: 5.8 %M (ref 0–12)
Platelet Count, POC: 358 10*3/uL (ref 142–424)
RBC: 3.8 M/uL — AB (ref 4.69–6.13)
RDW, POC: 13.6 %
WBC: 11 10*3/uL — AB (ref 4.6–10.2)

## 2015-01-11 LAB — BASIC METABOLIC PANEL WITH GFR
BUN: 21 mg/dL (ref 6–23)
CO2: 29 mEq/L (ref 19–32)
Calcium: 9.4 mg/dL (ref 8.4–10.5)
Chloride: 100 mEq/L (ref 96–112)
Creat: 1.19 mg/dL (ref 0.50–1.35)
GFR, Est African American: 70 mL/min
GFR, Est Non African American: 60 mL/min
Glucose, Bld: 120 mg/dL — ABNORMAL HIGH (ref 70–99)
Potassium: 4.7 mEq/L (ref 3.5–5.3)
Sodium: 136 mEq/L (ref 135–145)

## 2015-01-11 MED ORDER — LEVOFLOXACIN 500 MG PO TABS: 500.0000 mg | ORAL_TABLET | Freq: Every day | ORAL | Status: DC

## 2015-01-11 MED ORDER — CLONAZEPAM 1 MG PO TABS: 1.0000 mg | ORAL_TABLET | Freq: Every evening | ORAL | Status: DC | PRN

## 2015-01-11 MED ORDER — ALBUTEROL SULFATE HFA 108 (90 BASE) MCG/ACT IN AERS: 2.0000 | INHALATION_SPRAY | Freq: Four times a day (QID) | RESPIRATORY_TRACT | Status: DC | PRN

## 2015-01-11 MED ORDER — PREDNISONE 20 MG PO TABS: ORAL_TABLET | ORAL | Status: AC

## 2015-01-11 NOTE — Patient Instructions (Signed)
Onalee Huaavid, I'm concerned about your weight loss and lung function. Hopefully the antibiotic and prednisone leak in your back on your feet. At the same time I'm giving her something for sleep which she can take twice a day if you need to for your nerves. I recommend you hold off on the Celexa while you're taking the antibiotic so that there is no drug interaction.

## 2015-01-11 NOTE — Progress Notes (Addendum)
Subjective:    Patient ID: Thomas Mendoza, male    DOB: Mar 14, 1941, 74 y.o.   MRN: [id number] This chart was scribed for Elvina SidleKurt Lauenstein, MD by Littie Deedsichard Sun, Medical Scribe. This patient was seen in Room 11 and the patient's care was started at 2:27 PM.   HPI HPI Comments: Thomas Mendoza is a 74 y.o. male with a hx of COPD with chronic bronchitis who presents to the [facility] complaining of gradual onset URI symptoms that started 2 weeks ago. Patient reports having productive cough, congestion and SOB. He has been taking Mucinex for his symptoms. He has lost weight (20 lbs) which he attributes to stress. Patient denies diaphoresis, vomiting, and diarrhea. He has been eating normally. He states he has cut back on smoking some. His last CXR was in September, 2015.  Patient states things could be better at home. He lives at home with his wife, daughter and grandson. His wife has tactile hallucinations and Alzheimer's.   Review of Systems  HENT: Positive for congestion.   Respiratory: Positive for cough and shortness of breath.   Gastrointestinal: Negative for vomiting and diarrhea.       Objective:   Physical Exam CONSTITUTIONAL: Well developed/well nourished. Patient appears to be mildly short of breath. HEAD: Normocephalic/atraumatic EYES: EOM/PERRL ENMT: Mucous membranes moist NECK: supple no meningeal signs SPINE: entire spine nontender CV: S1/S2 noted, no murmurs/rubs/gallops noted LUNGS: Sonorous rhonchi, left chest. Decreased breath sounds in the right. ABDOMEN: soft, nontender, no rebound or guarding GU: no cva tenderness NEURO: Pt is awake/alert, moves all extremitiesx4 EXTREMITIES: pulses normal, full ROM SKIN: warm, color normal PSYCH: no abnormalities of mood noted  [facility] reading (PRIMARY) by  Dr. Milus GlazierLauenstein chest x-ray shows hyperinflation, left lower lobe bleb, hazy right diaphragmatic region of right lower lobe suggesting possible early  pneumonia..  Results for orders placed or performed in visit on 01/11/15  POCT CBC  Result Value Ref Range   WBC 11.0 (A) 4.6 - 10.2 K/uL   Lymph, poc 2.4 0.6 - 3.4   POC LYMPH PERCENT 21.6 10 - 50 %L   MID (cbc) 0.6 0 - 0.9   POC MID % 5.8 0 - 12 %M   POC Granulocyte 8.0 (A) 2 - 6.9   Granulocyte percent 72.6 37 - 80 %G   RBC 3.80 (A) 4.69 - 6.13 M/uL   Hemoglobin 13.1 (A) 14.1 - 18.1 g/dL   HCT, POC 40.939.2 (A) 81.143.5 - 53.7 %   MCV 103 (A) 80 - 97 fL   MCH, POC 34.5 (A) 27 - 31.2 pg   MCHC 33.5 31.8 - 35.4 g/dL   RDW, POC 91.413.6 %   Platelet Count, POC 358 142 - 424 K/uL   MPV 5.9 0 - 99.8 fL   Results for orders placed or performed in visit on 01/11/15  POCT CBC  Result Value Ref Range   WBC 11.0 (A) 4.6 - 10.2 K/uL   Lymph, poc 2.4 0.6 - 3.4   POC LYMPH PERCENT 21.6 10 - 50 %L   MID (cbc) 0.6 0 - 0.9   POC MID % 5.8 0 - 12 %M   POC Granulocyte 8.0 (A) 2 - 6.9   Granulocyte percent 72.6 37 - 80 %G   RBC 3.80 (A) 4.69 - 6.13 M/uL   Hemoglobin 13.1 (A) 14.1 - 18.1 g/dL   HCT, POC 78.239.2 (A) 95.643.5 - 53.7 %   MCV 103 (A) 80 - 97 fL  MCH, POC 34.5 (A) 27 - 31.2 pg   MCHC 33.5 31.8 - 35.4 g/dL   RDW, POC 16.113.6 %   Platelet Count, POC 358 142 - 424 K/uL   MPV 5.9 0 - 99.8 fL        Assessment & Plan:   This chart was scribed in my presence and reviewed by me personally.    ICD-9-CM ICD-10-CM   1. Dyspnea 786.09 R06.00 DG Chest 2 View     POCT CBC     BASIC METABOLIC PANEL WITH GFR     DG Chest 2 View     predniSONE (DELTASONE) 20 MG tablet     levofloxacin (LEVAQUIN) 500 MG tablet     albuterol (PROVENTIL HFA;VENTOLIN HFA) 108 (90 BASE) MCG/ACT inhaler  2. Loss of weight 783.21 R63.4 predniSONE (DELTASONE) 20 MG tablet     levofloxacin (LEVAQUIN) 500 MG tablet     albuterol (PROVENTIL HFA;VENTOLIN HFA) 108 (90 BASE) MCG/ACT inhaler  3. Insomnia 780.52 G47.00 clonazePAM (KLONOPIN) 1 MG tablet     albuterol (PROVENTIL HFA;VENTOLIN HFA) 108 (90 BASE) MCG/ACT inhaler  4.  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 COPD exacerbation 491.21 J44.1 albuterol (PROVENTIL HFA;VENTOLIN HFA) 108 (90 BASE) MCG/ACT inhaler   Recheck 5 days  Signed, Elvina SidleKurt Lauenstein, MD

## 2015-01-12 ENCOUNTER — Encounter: Payer: Self-pay | Admitting: *Deleted

## 2015-01-15 ENCOUNTER — Encounter: Payer: Self-pay | Admitting: Radiology

## 2015-02-23 ENCOUNTER — Other Ambulatory Visit: Payer: Self-pay | Admitting: Family Medicine

## 2015-04-28 ENCOUNTER — Other Ambulatory Visit: Payer: Self-pay | Admitting: Family Medicine

## 2015-05-01 ENCOUNTER — Other Ambulatory Visit: Payer: Self-pay | Admitting: Family Medicine

## 2015-05-01 NOTE — Telephone Encounter (Signed)
Pt is requesting a refill of KLONOPIN 90 day supply.

## 2015-05-01 NOTE — Telephone Encounter (Signed)
Pt is requesting a 90 day supply of klonopin. Dr L is it [facility] for [place] to call pharm and OK a 90 day fill instead of #30 w/2RFs?

## 2015-05-06 ENCOUNTER — Telehealth: Payer: Self-pay | Admitting: *Deleted

## 2015-05-06 NOTE — Telephone Encounter (Signed)
Phoned in Klonopin 

## 2015-05-14 ENCOUNTER — Other Ambulatory Visit: Payer: Self-pay | Admitting: Family Medicine

## 2015-05-16 ENCOUNTER — Other Ambulatory Visit: Payer: Self-pay | Admitting: Family Medicine

## 2015-05-16 ENCOUNTER — Ambulatory Visit (INDEPENDENT_AMBULATORY_CARE_PROVIDER_SITE_OTHER): Payer: 59 | Admitting: Family Medicine

## 2015-05-16 VITALS — BP 134/60 | HR 83 | Temp 97.6°F | Resp 17 | Ht 67.0 in | Wt 141.0 lb

## 2015-05-16 DIAGNOSIS — Z23 Encounter for immunization: Secondary | ICD-10-CM | POA: Diagnosis not present

## 2015-05-16 DIAGNOSIS — E785 Hyperlipidemia, unspecified: Secondary | ICD-10-CM | POA: Diagnosis not present

## 2015-05-16 DIAGNOSIS — F329 Major depressive disorder, single episode, unspecified: Secondary | ICD-10-CM | POA: Diagnosis not present

## 2015-05-16 DIAGNOSIS — J410 Simple chronic bronchitis: Secondary | ICD-10-CM

## 2015-05-16 DIAGNOSIS — G4700 Insomnia, unspecified: Secondary | ICD-10-CM | POA: Diagnosis not present

## 2015-05-16 DIAGNOSIS — I10 Essential (primary) hypertension: Secondary | ICD-10-CM

## 2015-05-16 DIAGNOSIS — F32A Depression, unspecified: Secondary | ICD-10-CM

## 2015-05-16 LAB — LIPID PANEL
Cholesterol: 140 mg/dL (ref 125–200)
HDL: 34 mg/dL — ABNORMAL LOW (ref >=40)
LDL Cholesterol: 79 mg/dL (ref <130)
Total CHOL/HDL Ratio: 4.1 Ratio (ref <=5.0)
Triglycerides: 137 mg/dL (ref <150)
VLDL: 27 mg/dL (ref <30)

## 2015-05-16 LAB — COMPLETE METABOLIC PANEL WITH GFR
ALT: 8 U/L — ABNORMAL LOW (ref 9–46)
AST: 15 U/L (ref 10–35)
Albumin: 3.8 g/dL (ref 3.6–5.1)
Alkaline Phosphatase: 52 U/L (ref 40–115)
BUN: 21 mg/dL (ref 7–25)
CO2: 26 mmol/L (ref 20–31)
Calcium: 9.6 mg/dL (ref 8.6–10.3)
Chloride: 101 mmol/L (ref 98–110)
Creat: 0.96 mg/dL (ref 0.70–1.18)
GFR, Est African American: >89 mL/min (ref >=60)
GFR, Est Non African American: 78 mL/min (ref >=60)
Glucose, Bld: 172 mg/dL — ABNORMAL HIGH (ref 65–99)
Potassium: 4.6 mmol/L (ref 3.5–5.3)
Sodium: 135 mmol/L (ref 135–146)
Total Bilirubin: 0.4 mg/dL (ref 0.2–1.2)
Total Protein: 6.9 g/dL (ref 6.1–8.1)

## 2015-05-16 LAB — POCT CBC
Granulocyte percent: 68.2 %G (ref 37–80)
HCT, POC: 41.3 % — AB (ref 43.5–53.7)
Hemoglobin: 13.8 g/dL — AB (ref 14.1–18.1)
Lymph, poc: 2 (ref 0.6–3.4)
MCH, POC: 34.5 pg — AB (ref 27–31.2)
MCHC: 33.4 g/dL (ref 31.8–35.4)
MCV: 103.1 fL — AB (ref 80–97)
MID (cbc): 0.7 (ref 0–0.9)
MPV: 5.7 fL (ref 0–99.8)
POC Granulocyte: 5.8 (ref 2–6.9)
POC LYMPH PERCENT: 23.7 %L (ref 10–50)
POC MID %: 8.1 %M (ref 0–12)
Platelet Count, POC: 267 10*3/uL (ref 142–424)
RBC: 4.01 M/uL — AB (ref 4.69–6.13)
RDW, POC: 13.4 %
WBC: 8.5 10*3/uL (ref 4.6–10.2)

## 2015-05-16 MED ORDER — ALBUTEROL SULFATE HFA 108 (90 BASE) MCG/ACT IN AERS
INHALATION_SPRAY | RESPIRATORY_TRACT | Status: DC
Start: 2015-05-16 — End: 2015-09-30

## 2015-05-16 MED ORDER — CITALOPRAM HYDROBROMIDE 40 MG PO TABS: ORAL_TABLET | ORAL | Status: AC

## 2015-05-16 MED ORDER — PRAVASTATIN SODIUM 20 MG PO TABS: 20.0000 mg | ORAL_TABLET | Freq: Every day | ORAL | Status: AC

## 2015-05-16 MED ORDER — CLONAZEPAM 1 MG PO TABS
ORAL_TABLET | ORAL | Status: DC
Start: 2015-05-16 — End: 2015-09-30

## 2015-05-16 MED ORDER — LISINOPRIL 10 MG PO TABS
10.0000 mg | ORAL_TABLET | Freq: Every day | ORAL | Status: AC
Start: 2015-05-16 — End: ?

## 2015-05-16 NOTE — Progress Notes (Signed)
@UMFCLOGO @  This chart was scribed for Elvina Sidle, MD by Andrew Au, ED Scribe. This patient was seen in room 10 and the patient's care was started at 2:57 PM.  Patient ID: Thomas Mendoza MRN: [id number], DOB: 08/04/1941, 74 y.o. Date of Encounter: 05/16/2015, 2:57 PM  Primary Physician: Elvina Sidle, MD  Chief Complaint:  Chief Complaint  Patient presents with  . Follow-up  . Pneumonia    HPI: 74 y.o. year old male with history below presents for a follow up of pneumonia. Pt was seen here 01/11/15 for URI symptoms and was diagnosed with dyspnea. Pt has been doing well. He is still smoking but has cut back.  Pt is still going through stress at home with his wife. His wife has tactile hallucinations and Alzheimer's and states she is not getting better. Pt also has his 27 y.o, deaf grandson living with him and well as his daughter who had a stroke at 43 y.o and is now 37.   Pt is also needing refills of all medication. Pt agrees to have flu vaccine today. He had prevnar and pneumovax.   Past Medical History  Diagnosis Date  . Hyperlipidemia   . Hypertension   . Depression   . Emphysema of lung      Home Meds: Prior to Admission medications   Medication Sig Start Date End Date Taking? Authorizing Provider  aspirin 81 MG tablet Take 81 mg by mouth at bedtime.      Historical Provider, MD  citalopram (CELEXA) 40 MG tablet TAKE 1 TABLET (40 MG TOTAL) BY MOUTH DAILY. 05/14/15   Chelle Jeffery, PA-C  clonazePAM (KLONOPIN) 1 MG tablet TAKE 1 TABLET BY MOUTH AT BEDTIME AS NEEDED FOR ANXIETY 05/02/15   Elvina Sidle, MD  lisinopril (PRINIVIL,ZESTRIL) 10 MG tablet TAKE 1 TABLET BY MOUTH EVERY DAY 05/14/15   Chelle Jeffery, PA-C  pravastatin (PRAVACHOL) 20 MG tablet TAKE 1 TABLET BY MOUTH EVERY DAY 05/14/15   Chelle Jeffery, PA-C  predniSONE (DELTASONE) 20 MG tablet Two daily with food 01/11/15   Elvina Sidle, MD  PROVENTIL HFA 108 (90 BASE) MCG/ACT inhaler INHALE 2 PUFFS INTO THE LUNGS  EVERY 6 HOURS AS NEEDED FOR WHEEZING OR SHORTNESS OF BREATH. 02/23/15   Elvina Sidle, MD  zoster vaccine live, PF, (ZOSTAVAX) 40981 UNT/0.65ML injection Inject 19,400 Units into the skin once. 05/29/14   Elvina Sidle, MD    Allergies: No Known Allergies  Social History   Social History  . Marital Status: Married    Spouse Name: N/A  . Number of Children: N/A  . Years of Education: N/A   Occupational History  . Not on file.   Social History Main Topics  . Smoking status: Current Every Day Smoker -- 1.00 packs/day    Types: Cigarettes  . Smokeless tobacco: Not on file  . Alcohol Use: No  . Drug Use: No  . Sexual Activity: Yes     Comment: number of sex partners in the last 12 months  1   Other Topics Concern  . Not on file   Social History Narrative   Exercise walking     Review of Systems: Constitutional: negative for chills, fever, night sweats, weight changes, or fatigue  HEENT: negative for vision changes, hearing loss, congestion, rhinorrhea, ST, epistaxis, or sinus pressure Cardiovascular: negative for chest pain or palpitations Respiratory: negative for hemoptysis, wheezing, shortness of breath, or cough Abdominal: negative for abdominal pain, nausea, vomiting, diarrhea, or constipation Dermatological: negative for rash Neurologic: negative  for headache, dizziness, or syncope All other systems reviewed and are otherwise negative with the exception to those above and in the HPI.   Physical Exam: Blood pressure 134/60, pulse 83, temperature 97.6 F (36.4 C), temperature source Oral, resp. rate 17, height  (1.702 m), weight 141 lb (63.957 kg), SpO2 90 %., Body mass index is 22.08 kg/(m^2). General: Well developed, well nourished, in no acute distress. Head: Normocephalic, atraumatic, eyes without discharge, sclera non-icteric, nares are without discharge. Bilateral auditory canals clear, TM's are without perforation, pearly grey and translucent with  reflective cone of light bilaterally. Oral cavity moist, posterior pharynx without exudate, erythema, peritonsillar abscess, or post nasal drip.  Neck: Supple. No thyromegaly. Full ROM. No lymphadenopathy. Lungs: Clear bilaterally to auscultation without wheezes, rales, or rhonchi. Breathing is unlabored. Heart: RRR with S1 S2. No murmurs, rubs, or gallops appreciated. Abdomen: Soft, non-tender, non-distended with normoactive bowel sounds. No hepatomegaly. No rebound/guarding. No obvious abdominal masses. Msk:  Strength and tone normal for age. Extremities/Skin: Warm and dry. No clubbing or cyanosis. No edema. No rashes or suspicious lesions. Neuro: Alert and oriented X 3. Moves all extremities spontaneously. Gait is normal. CNII-XII grossly in tact. Psych:  Responds to questions appropriately with a normal affect.    ASSESSMENT AND PLAN:  74 y.o. year old male with COPD which is stable at this time. He is under a tremendous not at stress with a wife who is completely disabled and landed on him for dressing and feeding. He also has a deaf son and a daughter who has had a stroke on her left side, both of whom are dependent on him. He never has a day off.  This chart was scribed in my presence and reviewed by me personally.    ICD-9-CM ICD-10-CM   1. Depression 311 F32.9 citalopram (CELEXA) 40 MG tablet  2. Simple chronic bronchitis 491.0 J41.0 albuterol (PROVENTIL HFA) 108 (90 BASE) MCG/ACT inhaler     POCT CBC  3. Insomnia 780.52 G47.00 clonazePAM (KLONOPIN) 1 MG tablet  4. Essential hypertension 401.9 I10 lisinopril (PRINIVIL,ZESTRIL) 10 MG tablet     POCT CBC     COMPLETE METABOLIC PANEL WITH GFR  5. Hyperlipidemia 272.4 E78.5 pravastatin (PRAVACHOL) 20 MG tablet     Lipid panel  6. Need for prophylactic vaccination and inoculation against influenza V04.81 Z23 Flu Vaccine QUAD 36+ mos IM   Signed, Elvina Sidle, MD 05/16/2015 2:57 PM

## 2015-05-16 NOTE — Patient Instructions (Signed)

## 2015-05-19 LAB — HEMOGLOBIN A1C
Hgb A1c MFr Bld: 5.9 % — ABNORMAL HIGH (ref <5.7)
Mean Plasma Glucose: 123 mg/dL — ABNORMAL HIGH (ref <117)

## 2015-06-02 ENCOUNTER — Encounter: Payer: 59 | Admitting: Physician Assistant

## 2015-09-30 ENCOUNTER — Ambulatory Visit (INDEPENDENT_AMBULATORY_CARE_PROVIDER_SITE_OTHER): Payer: 59 | Admitting: Family Medicine

## 2015-09-30 VITALS — BP 130/68 | HR 80 | Temp 97.7°F | Resp 20 | Ht 67.0 in | Wt 143.4 lb

## 2015-09-30 DIAGNOSIS — G4700 Insomnia, unspecified: Secondary | ICD-10-CM

## 2015-09-30 DIAGNOSIS — J441 Chronic obstructive pulmonary disease with (acute) exacerbation: Secondary | ICD-10-CM | POA: Diagnosis not present

## 2015-09-30 MED ORDER — TIOTROPIUM BROMIDE MONOHYDRATE 18 MCG IN CAPS: 18.0000 ug | ORAL_CAPSULE | Freq: Two times a day (BID) | RESPIRATORY_TRACT | Status: AC

## 2015-09-30 MED ORDER — CLONAZEPAM 1 MG PO TABS: ORAL_TABLET | ORAL | Status: AC

## 2015-09-30 NOTE — Patient Instructions (Signed)
Have pharmacist call me if inhalers too expensive.

## 2015-09-30 NOTE — Progress Notes (Signed)
This is 89 year oldretired man with COPD and great stress secondary to his wife's Alzheimer's and tactile hallucinations. He comes in for refill on his medications. He continues to smoke. Says that the clonazepam is working really well for him.  He says that the insurance that he  Has does not cover albuterol inhalers.   Objective:BP 130/68 mmHg  Pulse 80  Temp(Src) 97.7 F (36.5 C) (Oral)  Resp 20  Ht  (1.702 m)  Wt 143 lb 6.4 oz (65.046 kg)  BMI 22.45 kg/m2  SpO2 93%  HEENT: Poor dental hygiene   chest: Diffuse rhonchi bibasilar rales. Patient has purse type expiratory breathing and has a barrel chest. Heart: Sounds are distant , regular, without murmur Extremities: No edema  Skin: no rash   Assessment: COPD and significant stress. No significant change   Plan: Refill medications, although we will change from albuterol to Spiriva Elvina Sidle M.D.

## 2016-03-30 ENCOUNTER — Encounter (HOSPITAL_COMMUNITY): Payer: Self-pay | Admitting: Emergency Medicine

## 2016-03-30 ENCOUNTER — Emergency Department (HOSPITAL_COMMUNITY): Payer: Medicare Other

## 2016-03-30 DIAGNOSIS — G9340 Encephalopathy, unspecified: Secondary | ICD-10-CM | POA: Diagnosis present

## 2016-03-30 DIAGNOSIS — I447 Left bundle-branch block, unspecified: Secondary | ICD-10-CM | POA: Diagnosis present

## 2016-03-30 DIAGNOSIS — I214 Non-ST elevation (NSTEMI) myocardial infarction: Secondary | ICD-10-CM | POA: Diagnosis not present

## 2016-03-30 DIAGNOSIS — T380X5A Adverse effect of glucocorticoids and synthetic analogues, initial encounter: Secondary | ICD-10-CM | POA: Diagnosis present

## 2016-03-30 DIAGNOSIS — R34 Anuria and oliguria: Secondary | ICD-10-CM | POA: Diagnosis present

## 2016-03-30 DIAGNOSIS — Z825 Family history of asthma and other chronic lower respiratory diseases: Secondary | ICD-10-CM

## 2016-03-30 DIAGNOSIS — Z7982 Long term (current) use of aspirin: Secondary | ICD-10-CM

## 2016-03-30 DIAGNOSIS — Z79899 Other long term (current) drug therapy: Secondary | ICD-10-CM

## 2016-03-30 DIAGNOSIS — R680 Hypothermia, not associated with low environmental temperature: Secondary | ICD-10-CM | POA: Diagnosis present

## 2016-03-30 DIAGNOSIS — I255 Ischemic cardiomyopathy: Secondary | ICD-10-CM

## 2016-03-30 DIAGNOSIS — A419 Sepsis, unspecified organism: Secondary | ICD-10-CM

## 2016-03-30 DIAGNOSIS — Z6820 Body mass index (BMI) 20.0-20.9, adult: Secondary | ICD-10-CM

## 2016-03-30 DIAGNOSIS — E872 Acidosis: Secondary | ICD-10-CM | POA: Diagnosis present

## 2016-03-30 DIAGNOSIS — I739 Peripheral vascular disease, unspecified: Secondary | ICD-10-CM | POA: Diagnosis present

## 2016-03-30 DIAGNOSIS — E875 Hyperkalemia: Secondary | ICD-10-CM | POA: Diagnosis present

## 2016-03-30 DIAGNOSIS — Z515 Encounter for palliative care: Secondary | ICD-10-CM | POA: Diagnosis not present

## 2016-03-30 DIAGNOSIS — J810 Acute pulmonary edema: Secondary | ICD-10-CM | POA: Diagnosis present

## 2016-03-30 DIAGNOSIS — I70208 Unspecified atherosclerosis of native arteries of extremities, other extremity: Secondary | ICD-10-CM | POA: Diagnosis present

## 2016-03-30 DIAGNOSIS — F17210 Nicotine dependence, cigarettes, uncomplicated: Secondary | ICD-10-CM | POA: Diagnosis present

## 2016-03-30 DIAGNOSIS — R570 Cardiogenic shock: Secondary | ICD-10-CM | POA: Diagnosis present

## 2016-03-30 DIAGNOSIS — R739 Hyperglycemia, unspecified: Secondary | ICD-10-CM | POA: Diagnosis present

## 2016-03-30 DIAGNOSIS — E871 Hypo-osmolality and hyponatremia: Secondary | ICD-10-CM | POA: Diagnosis present

## 2016-03-30 DIAGNOSIS — R64 Cachexia: Secondary | ICD-10-CM | POA: Diagnosis present

## 2016-03-30 DIAGNOSIS — Z978 Presence of other specified devices: Secondary | ICD-10-CM

## 2016-03-30 DIAGNOSIS — I10 Essential (primary) hypertension: Secondary | ICD-10-CM | POA: Diagnosis present

## 2016-03-30 DIAGNOSIS — R0602 Shortness of breath: Secondary | ICD-10-CM

## 2016-03-30 DIAGNOSIS — R6521 Severe sepsis with septic shock: Secondary | ICD-10-CM

## 2016-03-30 DIAGNOSIS — J441 Chronic obstructive pulmonary disease with (acute) exacerbation: Secondary | ICD-10-CM

## 2016-03-30 DIAGNOSIS — J449 Chronic obstructive pulmonary disease, unspecified: Secondary | ICD-10-CM | POA: Diagnosis present

## 2016-03-30 DIAGNOSIS — I2510 Atherosclerotic heart disease of native coronary artery without angina pectoris: Secondary | ICD-10-CM | POA: Diagnosis present

## 2016-03-30 DIAGNOSIS — J9601 Acute respiratory failure with hypoxia: Secondary | ICD-10-CM | POA: Diagnosis present

## 2016-03-30 DIAGNOSIS — N179 Acute kidney failure, unspecified: Secondary | ICD-10-CM

## 2016-03-30 DIAGNOSIS — E785 Hyperlipidemia, unspecified: Secondary | ICD-10-CM | POA: Diagnosis present

## 2016-03-30 DIAGNOSIS — I25700 Atherosclerosis of coronary artery bypass graft(s), unspecified, with unstable angina pectoris: Secondary | ICD-10-CM

## 2016-03-30 DIAGNOSIS — F329 Major depressive disorder, single episode, unspecified: Secondary | ICD-10-CM | POA: Diagnosis present

## 2016-03-30 LAB — I-STAT ARTERIAL BLOOD GAS, ED
Acid-base deficit: 17 mmol/L — ABNORMAL HIGH (ref 0.0–2.0)
BICARBONATE: 13.1 meq/L — AB (ref 20.0–24.0)
O2 Saturation: 94 %
PCO2 ART: 40.3 mmHg (ref 35.0–45.0)
Patient temperature: 93.1
TCO2: 14 mmol/L (ref 0–100)
pH, Arterial: 7.101 — CL (ref 7.350–7.450)
pO2, Arterial: 82 mmHg (ref 80.0–100.0)

## 2016-03-30 MED ORDER — ALBUTEROL (5 MG/ML) CONTINUOUS INHALATION SOLN
10.0000 mg/h | INHALATION_SOLUTION | Freq: Once | RESPIRATORY_TRACT | Status: DC
  Administered 2016-03-31: 10 mg/h via RESPIRATORY_TRACT
  Filled 2016-03-30: qty 20

## 2016-03-30 MED ORDER — SODIUM CHLORIDE 0.9 % IV BOLUS (SEPSIS)
1000.0000 mL | Freq: Once | INTRAVENOUS | Status: AC
  Administered 2016-03-31: 1000 mL via INTRAVENOUS

## 2016-03-30 MED ORDER — MAGNESIUM SULFATE 2 GM/50ML IV SOLN
2.0000 g | Freq: Once | INTRAVENOUS | Status: AC
  Administered 2016-03-31: 2 g via INTRAVENOUS
  Filled 2016-03-30: qty 50

## 2016-03-30 NOTE — ED Triage Notes (Signed)
Pt brought to ED by [facility] from home for respiratory distress, pt placed on C pap by [facility] on arrival to his house with a SPO2 70's-80's, pt were using inhalers at home for SOB with no relieve. Albuterol, atrovent, 125 mg Solumedrol 0.3 Epi IM given PTA to ED. Pt has hx of COPD, HTN, denies CP or dizziness pt having some decrease LOC.

## 2016-03-30 NOTE — ED Provider Notes (Addendum)
[facility]-EMERGENCY DEPT Provider Note   CSN: [id number] Arrival date & time: Apr 03, 2016  2338  First Provider Contact:  11:38 PM   By signing my name below, I, Thomas Mendoza, attest that this documentation has been prepared under the direction and in the presence of Shon Baton, MD. Electronically Signed: Rosario Mendoza, ED Scribe. 03-Apr-2016. 11:52 PM.  History   Chief Complaint Chief Complaint  Patient presents with  . Respiratory Distress   LEVEL 5 CAVEAT: HPI and ROS limited due to acuity of condition  HPI  HPI Comments: Thomas Mendoza is a 75 y.o. male brought in by ambulance, with a PMHx of COPD, HTN, MI, and HLD, who presents to the Emergency Department complaining of gradual onset, unchanged, constant SOB and wheezing onset just PTA. Per EMS, pt tried using his inhaler at home with no relief of his symptoms. EMS placed a 20 gauge in the right [facility] en route. Pt received 125mg  of Solumedrol, 0.3 of Epinephrine IM, 15 of Albuterol, 1 of Atrovent, and was placed on CPAP en route. They noted that he has had no relief of his symptoms. No hx of CHF. Vitals on board were BP: 97/57, HR: 140, and SP02 in the 70's-80's. Denies CP, cough, fever, dizziness, or recent illnesses.     Additional information obtained from wife. Reports that the patient wasn't feeling well today. However, his shortness of breath came on rather suddenly. No fevers at home.  Past Medical History:  Diagnosis Date  . Depression   . Emphysema of lung (HCC)   . Hyperlipidemia   . Hypertension     Patient Active Problem List   Diagnosis Date Noted  . Respiratory failure (HCC) 03/11/2016  . Hypertension 03/07/2013  . Depression 05/03/2012  . Hyperlipidemia 05/03/2012  . Right inguinal hernia 08/11/2011  . COPD (chronic obstructive pulmonary disease) with chronic bronchitis (HCC) 08/11/2011    Past Surgical History:  Procedure Laterality Date  . APPENDECTOMY    . EYE SURGERY      Home  Medications    Prior to Admission medications   Medication Sig Start Date End Date Taking? Authorizing Provider  aspirin 81 MG tablet Take 81 mg by mouth at bedtime.      Historical Provider, MD  citalopram (CELEXA) 40 MG tablet TAKE 1 TABLET (40 MG TOTAL) BY MOUTH DAILY. 05/16/15   Elvina Sidle, MD  clonazePAM (KLONOPIN) 1 MG tablet TAKE 1 TABLET BY MOUTH AT BEDTIME AS NEEDED FOR ANXIETY 09/30/15   Elvina Sidle, MD  lisinopril (PRINIVIL,ZESTRIL) 10 MG tablet Take 1 tablet (10 mg total) by mouth daily. 05/16/15   Elvina Sidle, MD  pravastatin (PRAVACHOL) 20 MG tablet Take 1 tablet (20 mg total) by mouth daily. 05/16/15   Elvina Sidle, MD  predniSONE (DELTASONE) 20 MG tablet Two daily with food Patient not taking: Reported on 05/16/2015 01/11/15   Elvina Sidle, MD  tiotropium (SPIRIVA HANDIHALER) 18 MCG inhalation capsule Place 1 capsule (18 mcg total) into inhaler and inhale 2 (two) times daily. 09/30/15   Elvina Sidle, MD  zoster vaccine live, PF, (ZOSTAVAX) 66063 UNT/0.65ML injection Inject 19,400 Units into the skin once. 05/29/14   Elvina Sidle, MD    Family History Family History  Problem Relation Age of Onset  . COPD Father   . Cancer Father   . Alcohol abuse Father     Social History Social History  Substance Use Topics  . Smoking status: Current Every Day Smoker    Packs/day: 1.00  Types: Cigarettes  . Smokeless tobacco: Current User  . Alcohol use No     Allergies   Review of patient's allergies indicates no known allergies.   Review of Systems Review of Systems  Constitutional: Negative for fever.  Respiratory: Positive for shortness of breath and wheezing. Negative for cough.   LEVEL 5 CAVEAT: HPI and ROS limited due to acuity of condition Physical Exam Updated Vital Signs BP (!) 81/62   Pulse (!) 124   Temp (!) 93.1 F (33.9 C) (Rectal)   Resp 24   Ht 5\' 9"  (1.753 m)   Wt 143 lb (64.9 kg)   SpO2 96%   BMI 21.12 kg/m   Physical Exam    Constitutional:  Ill-appearing, acute respiratory distress, increased work of breathing  HENT:  Head: Normocephalic and atraumatic.  Mucous membranes dry  Cardiovascular: Regular rhythm and normal heart sounds.   No murmur heard. Tachycardia  Pulmonary/Chest: He is in respiratory distress. He has no wheezes.  Diminished breath sounds in all lung fields, no significant wheezing, increased work of breathing, accessory muscle use  Abdominal: Soft. Bowel sounds are normal. There is no tenderness. There is no rebound.  Musculoskeletal: He exhibits no edema.  Lymphadenopathy:    He has no cervical adenopathy.  Neurological: He is alert.  Somnolent at times  Skin: Skin is warm and dry.  Psychiatric: He has a normal mood and affect.  Nursing note and vitals reviewed.   ED Treatments / Results  DIAGNOSTIC STUDIES: Oxygen Saturation is 100% on CPAP, normal by my interpretation.   Labs (all labs ordered are listed, but only abnormal results are displayed) Labs Reviewed  CBC - Abnormal; Notable for the following:       Result Value   WBC 13.2 (*)    RBC 3.46 (*)    Hemoglobin 12.5 (*)    HCT 37.8 (*)    MCV 109.2 (*)    MCH 36.1 (*)    All other components within normal limits  I-STAT CG4 LACTIC ACID, ED - Abnormal; Notable for the following:    Lactic Acid, Venous 8.37 (*)    All other components within normal limits  I-STAT ARTERIAL BLOOD GAS, ED - Abnormal; Notable for the following:    pH, Arterial 7.101 (*)    Bicarbonate 13.1 (*)    Acid-base deficit 17.0 (*)    All other components within normal limits  I-STAT TROPOININ, ED - Abnormal; Notable for the following:    Troponin i, poc 1.30 (*)    All other components within normal limits  CULTURE, BLOOD (ROUTINE X 2)  CULTURE, BLOOD (ROUTINE X 2)  URINE CULTURE  CULTURE, BLOOD (ROUTINE X 2)  CULTURE, BLOOD (ROUTINE X 2)  CBC WITH DIFFERENTIAL/PLATELET  COMPREHENSIVE METABOLIC PANEL  URINALYSIS, ROUTINE W REFLEX  MICROSCOPIC (NOT AT [facility])  CBC  BASIC METABOLIC PANEL  BLOOD GAS, ARTERIAL  COMPREHENSIVE METABOLIC PANEL  MAGNESIUM  PHOSPHORUS  TROPONIN I  TROPONIN I  TROPONIN I  LACTIC ACID, PLASMA  LACTIC ACID, PLASMA  LEGIONELLA PNEUMOPHILA SEROGP 1 UR AG  STREP PNEUMONIAE URINARY ANTIGEN    EKG  EKG Interpretation  Date/Time:  Wednesday March 30 2016 23:41:14 EDT Ventricular Rate:  124 PR Interval:    QRS Duration: 166 QT Interval:  344 QTC Calculation: 495 R Axis:   28 Text Interpretation:  Sinus or ectopic atrial tachycardia Left atrial enlargement IVCD, consider atypical LBBB IVCD new when compared to prior Confirmed by HORTON  MD, COURTNEY (  [id number]) on 2016/04/21 1:51:07 AM       Radiology Dg Chest Portable 1 View  Result Date: 04/21/2016 CLINICAL DATA:  Intubation EXAM: PORTABLE CHEST 1 VIEW COMPARISON:  03/18/2016; 01/11/2015; 05/29/2014 FINDINGS: Examination is degraded secondary to exclusion of the bilateral costophrenic angles. The endotracheal tube overlies the tracheal air column with tip approximately 2.4 cm above the carina. Enteric tube tip and side port project below the midline of the lower thorax. No definite pneumothorax. Grossly unchanged cardiac silhouette and mediastinal contours. Atherosclerotic plaque within thoracic aorta. The pulmonary vasculature is indistinct with cephalization of flow. Worsening perihilar heterogeneous airspace opacities. No definite pleural effusion, though note, the bilateral costophrenic angles are excluded from view. Unchanged bones. IMPRESSION: 1. Endotracheal tube tip overlies the tracheal air column, approximately 2.4 cm above the carina. No pneumothorax. 2. Enteric tube projects below the midline of the lower thorax, tip excluded from view. 3. Similar findings of suspected pulmonary edema with worsening perihilar opacities with differential considerations include atelectasis, worsening alveolar pulmonary edema and aspiration. 4.  Aortic  Atherosclerosis (ICD10-170.0) Electronically Signed   By: Simonne Come M.D.   On: Apr 21, 2016 00:40  Dg Chest Portable 1 View  Result Date: 04/21/16 CLINICAL DATA:  75 year old male with shortness of breath EXAM: PORTABLE CHEST 1 VIEW COMPARISON:  Chest radiograph dated 01/11/2015 FINDINGS: Single portable view of the chest demonstrates emphysematous changes of the lungs with interstitial coarsening, progressed compared to prior study. Areas of hazy density in the perihilar region with Kerley B-lines suggest mild congestion and edema. Superimposed pneumonia is not excluded. Clinical correlation is recommended. There is no focal consolidation. No pneumothorax. The cardiac silhouette is within normal limits with no acute osseous pathology. IMPRESSION: Mild pulmonary vascular congestion and interstitial edema on the background of emphysema. Pneumonia is not excluded. Clinical correlation is recommended. Electronically Signed   By: Elgie Collard M.D.   On: 04-21-16 00:11   Procedures Procedures (including critical care time)  CRITICAL CARE Performed by: Ross Marcus, MD Total critical care time: 60 minutes Critical care time was exclusive of separately billable procedures and treating other patients. Critical care was necessary to treat or prevent imminent or life-threatening deterioration. Critical care was time spent personally by me on the following activities: development of treatment plan with patient and/or surrogate as well as nursing, discussions with consultants, evaluation of patient's response to treatment, examination of patient, obtaining history from patient or surrogate, ordering and performing treatments and interventions, ordering and review of laboratory studies, ordering and review of radiographic studies, pulse oximetry and re-evaluation of patient's condition.  Medications Ordered in ED Medications  albuterol (PROVENTIL,VENTOLIN) solution continuous neb (not administered)    ketamine 100 mg in normal saline 10 mL ( /mL) syringe (not administered)  sodium chloride flush (NS) 0.9 % injection 3 mL (3 mLs Intravenous Not Given 04-21-16 0045)  sodium chloride flush (NS) 0.9 % injection 3 mL (not administered)  0.9 %  sodium chloride infusion (not administered)  acetaminophen (TYLENOL) tablet 650 mg (not administered)  ondansetron (ZOFRAN) injection 4 mg (not administered)  famotidine (PEPCID) IVPB 20 mg premix (not administered)  0.9 %  sodium chloride infusion (not administered)  fentaNYL (SUBLIMAZE) injection 50 mcg (not administered)  fentaNYL (SUBLIMAZE) 2,500 mcg in sodium chloride 0.9 % 250 mL (10 mcg/mL) infusion (not administered)  fentaNYL (SUBLIMAZE) bolus via infusion 25 mcg (not administered)  midazolam (VERSED) injection 1 mg (not administered)  midazolam (VERSED) injection 1 mg (not administered)  docusate (COLACE) 50 MG/5ML liquid 100  mg (not administered)  enoxaparin (LOVENOX) injection 40 mg (not administered)  azithromycin (ZITHROMAX) 500 mg in dextrose 5 % 250 mL IVPB (500 mg Intravenous New Bag/Given 03/29/2016 0043)  cefTRIAXone (ROCEPHIN) 1 g in dextrose 5 % 50 mL IVPB (not administered)  magnesium sulfate IVPB 2 g 50 mL (0 g Intravenous Stopped 03/28/2016 0015)  sodium chloride 0.9 % bolus 1,000 mL (0 mLs Intravenous Stopped 03/11/2016 0039)  sodium chloride 0.9 % bolus 1,000 mL (0 mLs Intravenous Stopped 03/26/2016 0118)  piperacillin-tazobactam (ZOSYN) IVPB 3.375 g (0 g Intravenous Stopped 03/05/2016 0105)  ketamine (KETALAR) injection (100 mg Intravenous Given 03/30/2016 0020)  rocuronium (ZEMURON) injection (100 mg Intravenous Given 03/06/2016 0020)  norepinephrine (LEVOPHED) 4 mg in dextrose 5 % 250 mL (0.016 mg/mL) infusion (20 mcg/min Intravenous Rate/Dose Change 03/26/2016 0048)     Initial Impression / Assessment and Plan / ED Course  I have reviewed the triage vital signs and the nursing notes.  Pertinent labs & imaging results that were available  during my care of the patient were reviewed by me and considered in my medical decision making (see chart for details).  Clinical Course    Patient presents in acute respiratory distress. Hypoxic with EMS. Now tachycardic with increased work of breathing. Initially hypotensive. Found to be hypothermic.  Sepsis workup initiated. Lactate of 8. He is also acidotic which appears to be a metabolic acidosis. Given patient's work of breathing and critical illness, he was intubated. He was given broad-spectrum antibiotics. 30 mL/kg of fluid.  He was started on levo fed for persistent hypotension. Critical care was consulted. After speaking to his wife, feel it would be prudent to scan him for PE as well given that he was not significantly wheezy on initial evaluation. He does not appear volume overloaded but chest x-ray does question some mild pulmonary edema. Clinically he does not appear volume overloaded.  1:00 am Repeat sepsis assessment completed. Patient persistently hypotensive. Levophed at 20. Maps greater than 65. Intubated. Critical CARE AT bedside.  1:55 AM Cardiology consultation. Troponin increased from 1.3 to 2.5.  Patient on initial evaluation denied chest pain. He remained hypotensive and in critical condition.  Bedside echo with slightly reduced EF. No pulmonary edema. Discussed with Dr. Shirlee Latch. Given that the patient is critically ill, he would not be taken to the Cath Lab.  He recommends continuing to cycle cardiac enzymes and stabilizing the patient. Once stabilized, recommends formal cardiology later today.   Final Clinical Impressions(s) / ED Diagnoses   Final diagnoses:  Acute respiratory failure with hypoxia (HCC)  COPD exacerbation (HCC)  Septic shock (HCC)    New Prescriptions New Prescriptions   No medications on file   I personally performed the services described in this documentation, which was scribed in my presence. The recorded information has been reviewed and is  accurate.     Shon Baton, MD 04/04/2016 1610    Shon Baton, MD 03/20/2016 0157

## 2016-03-31 ENCOUNTER — Inpatient Hospital Stay (HOSPITAL_COMMUNITY): Payer: Medicare Other

## 2016-03-31 ENCOUNTER — Encounter (HOSPITAL_COMMUNITY): Admission: EM | Disposition: E | Payer: Self-pay | Source: Home / Self Care | Attending: Pulmonary Disease

## 2016-03-31 ENCOUNTER — Other Ambulatory Visit (HOSPITAL_COMMUNITY): Payer: 59

## 2016-03-31 ENCOUNTER — Emergency Department (HOSPITAL_COMMUNITY): Payer: Medicare Other

## 2016-03-31 DIAGNOSIS — J441 Chronic obstructive pulmonary disease with (acute) exacerbation: Secondary | ICD-10-CM | POA: Diagnosis not present

## 2016-03-31 DIAGNOSIS — I25700 Atherosclerosis of coronary artery bypass graft(s), unspecified, with unstable angina pectoris: Secondary | ICD-10-CM

## 2016-03-31 DIAGNOSIS — R000 Tachycardia, unspecified: Secondary | ICD-10-CM | POA: Diagnosis not present

## 2016-03-31 DIAGNOSIS — R0602 Shortness of breath: Secondary | ICD-10-CM | POA: Insufficient documentation

## 2016-03-31 DIAGNOSIS — R739 Hyperglycemia, unspecified: Secondary | ICD-10-CM | POA: Diagnosis present

## 2016-03-31 DIAGNOSIS — I2101 ST elevation (STEMI) myocardial infarction involving left main coronary artery: Secondary | ICD-10-CM | POA: Diagnosis not present

## 2016-03-31 DIAGNOSIS — J9601 Acute respiratory failure with hypoxia: Secondary | ICD-10-CM | POA: Diagnosis present

## 2016-03-31 DIAGNOSIS — I10 Essential (primary) hypertension: Secondary | ICD-10-CM | POA: Diagnosis present

## 2016-03-31 DIAGNOSIS — I2510 Atherosclerotic heart disease of native coronary artery without angina pectoris: Secondary | ICD-10-CM | POA: Diagnosis present

## 2016-03-31 DIAGNOSIS — J810 Acute pulmonary edema: Secondary | ICD-10-CM | POA: Diagnosis present

## 2016-03-31 DIAGNOSIS — I255 Ischemic cardiomyopathy: Secondary | ICD-10-CM

## 2016-03-31 DIAGNOSIS — R570 Cardiogenic shock: Secondary | ICD-10-CM

## 2016-03-31 DIAGNOSIS — Z825 Family history of asthma and other chronic lower respiratory diseases: Secondary | ICD-10-CM | POA: Diagnosis not present

## 2016-03-31 DIAGNOSIS — R64 Cachexia: Secondary | ICD-10-CM | POA: Diagnosis present

## 2016-03-31 DIAGNOSIS — G9340 Encephalopathy, unspecified: Secondary | ICD-10-CM | POA: Diagnosis present

## 2016-03-31 DIAGNOSIS — R0600 Dyspnea, unspecified: Secondary | ICD-10-CM | POA: Diagnosis not present

## 2016-03-31 DIAGNOSIS — Z515 Encounter for palliative care: Secondary | ICD-10-CM | POA: Diagnosis not present

## 2016-03-31 DIAGNOSIS — F329 Major depressive disorder, single episode, unspecified: Secondary | ICD-10-CM | POA: Diagnosis present

## 2016-03-31 DIAGNOSIS — I214 Non-ST elevation (NSTEMI) myocardial infarction: Principal | ICD-10-CM

## 2016-03-31 DIAGNOSIS — Z978 Presence of other specified devices: Secondary | ICD-10-CM

## 2016-03-31 DIAGNOSIS — E785 Hyperlipidemia, unspecified: Secondary | ICD-10-CM | POA: Diagnosis present

## 2016-03-31 DIAGNOSIS — E871 Hypo-osmolality and hyponatremia: Secondary | ICD-10-CM | POA: Diagnosis present

## 2016-03-31 DIAGNOSIS — I739 Peripheral vascular disease, unspecified: Secondary | ICD-10-CM | POA: Diagnosis present

## 2016-03-31 DIAGNOSIS — J449 Chronic obstructive pulmonary disease, unspecified: Secondary | ICD-10-CM | POA: Diagnosis present

## 2016-03-31 DIAGNOSIS — R34 Anuria and oliguria: Secondary | ICD-10-CM | POA: Diagnosis present

## 2016-03-31 DIAGNOSIS — R680 Hypothermia, not associated with low environmental temperature: Secondary | ICD-10-CM | POA: Diagnosis present

## 2016-03-31 DIAGNOSIS — E872 Acidosis: Secondary | ICD-10-CM | POA: Diagnosis present

## 2016-03-31 DIAGNOSIS — N179 Acute kidney failure, unspecified: Secondary | ICD-10-CM | POA: Diagnosis present

## 2016-03-31 DIAGNOSIS — E875 Hyperkalemia: Secondary | ICD-10-CM | POA: Diagnosis present

## 2016-03-31 DIAGNOSIS — I447 Left bundle-branch block, unspecified: Secondary | ICD-10-CM | POA: Diagnosis present

## 2016-03-31 DIAGNOSIS — I70208 Unspecified atherosclerosis of native arteries of extremities, other extremity: Secondary | ICD-10-CM | POA: Diagnosis present

## 2016-03-31 DIAGNOSIS — F17210 Nicotine dependence, cigarettes, uncomplicated: Secondary | ICD-10-CM | POA: Diagnosis present

## 2016-03-31 HISTORY — PX: CARDIAC CATHETERIZATION: SHX172

## 2016-03-31 LAB — BASIC METABOLIC PANEL
ANION GAP: 14 (ref 5–15)
ANION GAP: 14 (ref 5–15)
Anion gap: 13 (ref 5–15)
Anion gap: 11 (ref 5–15)
Anion gap: 9 (ref 5–15)
BUN: 25 mg/dL — ABNORMAL HIGH (ref 6–20)
BUN: 27 mg/dL — AB (ref 6–20)
BUN: 30 mg/dL — AB (ref 6–20)
BUN: 33 mg/dL — AB (ref 6–20)
BUN: 33 mg/dL — AB (ref 6–20)
CALCIUM: 7.9 mg/dL — AB (ref 8.9–10.3)
CALCIUM: 7.5 mg/dL — AB (ref 8.9–10.3)
CHLORIDE: 105 mmol/L (ref 101–111)
CO2: 13 mmol/L — AB (ref 22–32)
CO2: 16 mmol/L — AB (ref 22–32)
CO2: 17 mmol/L — ABNORMAL LOW (ref 22–32)
CO2: 21 mmol/L — ABNORMAL LOW (ref 22–32)
CO2: 22 mmol/L (ref 22–32)
CREATININE: 2.42 mg/dL — AB (ref 0.61–1.24)
CREATININE: 2.76 mg/dL — AB (ref 0.61–1.24)
CREATININE: 3.06 mg/dL — AB (ref 0.61–1.24)
Calcium: 7.9 mg/dL — ABNORMAL LOW (ref 8.9–10.3)
Calcium: 7.7 mg/dL — ABNORMAL LOW (ref 8.9–10.3)
Calcium: 7.2 mg/dL — ABNORMAL LOW (ref 8.9–10.3)
Chloride: 105 mmol/L (ref 101–111)
Chloride: 105 mmol/L (ref 101–111)
Chloride: 102 mmol/L (ref 101–111)
Chloride: 102 mmol/L (ref 101–111)
Creatinine, Ser: 1.96 mg/dL — ABNORMAL HIGH (ref 0.61–1.24)
Creatinine, Ser: 3.1 mg/dL — ABNORMAL HIGH (ref 0.61–1.24)
GFR calc Af Amer: 37 mL/min — ABNORMAL LOW (ref >60)
GFR calc Af Amer: 29 mL/min — ABNORMAL LOW (ref >60)
GFR calc Af Amer: 24 mL/min — ABNORMAL LOW (ref >60)
GFR calc Af Amer: 22 mL/min — ABNORMAL LOW (ref >60)
GFR calc Af Amer: 21 mL/min — ABNORMAL LOW (ref >60)
GFR calc non Af Amer: 25 mL/min — ABNORMAL LOW (ref >60)
GFR calc non Af Amer: 18 mL/min — ABNORMAL LOW (ref >60)
GFR, EST NON AFRICAN AMERICAN: 32 mL/min — AB (ref >60)
GFR, EST NON AFRICAN AMERICAN: 21 mL/min — AB (ref >60)
GFR, EST NON AFRICAN AMERICAN: 19 mL/min — AB (ref >60)
GLUCOSE: 346 mg/dL — AB (ref 65–99)
GLUCOSE: 304 mg/dL — AB (ref 65–99)
GLUCOSE: 155 mg/dL — AB (ref 65–99)
Glucose, Bld: 261 mg/dL — ABNORMAL HIGH (ref 65–99)
Glucose, Bld: 195 mg/dL — ABNORMAL HIGH (ref 65–99)
POTASSIUM: 5.7 mmol/L — AB (ref 3.5–5.1)
POTASSIUM: 4.4 mmol/L (ref 3.5–5.1)
Potassium: 4.7 mmol/L (ref 3.5–5.1)
Potassium: 4.4 mmol/L (ref 3.5–5.1)
Potassium: 4.5 mmol/L (ref 3.5–5.1)
SODIUM: 135 mmol/L (ref 135–145)
SODIUM: 134 mmol/L — AB (ref 135–145)
SODIUM: 133 mmol/L — AB (ref 135–145)
Sodium: 132 mmol/L — ABNORMAL LOW (ref 135–145)
Sodium: 135 mmol/L (ref 135–145)

## 2016-03-31 LAB — BLOOD GAS, ARTERIAL
ACID-BASE DEFICIT: 12.3 mmol/L — AB (ref 0.0–2.0)
ACID-BASE DEFICIT: 14.9 mmol/L — AB (ref 0.0–2.0)
Bicarbonate: 16.3 mEq/L — ABNORMAL LOW (ref 20.0–24.0)
Bicarbonate: 13.8 mEq/L — ABNORMAL LOW (ref 20.0–24.0)
DRAWN BY: 418751
FIO2: 0.6
FIO2: 0.7
LHR: 28 {breaths}/min
MECHVT: 550 mL
MECHVT: 550 mL
O2 SAT: 91.5 %
O2 Saturation: 91.5 %
PATIENT TEMPERATURE: 98.6
PCO2 ART: 53.4 mmHg — AB (ref 35.0–45.0)
PEEP/CPAP: 5 cmH2O
PEEP: 5 cmH2O
PO2 ART: 87.8 mmHg (ref 80.0–100.0)
Patient temperature: 97.4
RATE: 24 resp/min
TCO2: 18.1 mmol/L (ref 0–100)
TCO2: 15.4 mmol/L (ref 0–100)
pCO2 arterial: 57.7 mmHg (ref 35.0–45.0)
pH, Arterial: 7.073 — CL (ref 7.350–7.450)
pH, Arterial: 7.042 — CL (ref 7.350–7.450)
pO2, Arterial: 84.9 mmHg (ref 80.0–100.0)

## 2016-03-31 LAB — URINALYSIS, ROUTINE W REFLEX MICROSCOPIC
Glucose, UA: NEGATIVE mg/dL
KETONES UR: 15 mg/dL — AB
NITRITE: NEGATIVE
PH: 5.5 (ref 5.0–8.0)
Protein, ur: >300 mg/dL — AB
SPECIFIC GRAVITY, URINE: 1.029 (ref 1.005–1.030)

## 2016-03-31 LAB — GLUCOSE, CAPILLARY
GLUCOSE-CAPILLARY: 312 mg/dL — AB (ref 65–99)
GLUCOSE-CAPILLARY: 255 mg/dL — AB (ref 65–99)
GLUCOSE-CAPILLARY: 193 mg/dL — AB (ref 65–99)
GLUCOSE-CAPILLARY: 207 mg/dL — AB (ref 65–99)
GLUCOSE-CAPILLARY: 176 mg/dL — AB (ref 65–99)
GLUCOSE-CAPILLARY: 142 mg/dL — AB (ref 65–99)
Glucose-Capillary: 417 mg/dL — ABNORMAL HIGH (ref 65–99)
Glucose-Capillary: 363 mg/dL — ABNORMAL HIGH (ref 65–99)
Glucose-Capillary: 352 mg/dL — ABNORMAL HIGH (ref 65–99)
Glucose-Capillary: 358 mg/dL — ABNORMAL HIGH (ref 65–99)
Glucose-Capillary: 254 mg/dL — ABNORMAL HIGH (ref 65–99)
Glucose-Capillary: 265 mg/dL — ABNORMAL HIGH (ref 65–99)
Glucose-Capillary: 154 mg/dL — ABNORMAL HIGH (ref 65–99)

## 2016-03-31 LAB — I-STAT ARTERIAL BLOOD GAS, ED
Acid-base deficit: 16 mmol/L — ABNORMAL HIGH (ref 0.0–2.0)
Bicarbonate: 16 mEq/L — ABNORMAL LOW (ref 20.0–24.0)
O2 SAT: 93 %
PCO2 ART: 62.8 mmHg — AB (ref 35.0–45.0)
PO2 ART: 92 mmHg (ref 80.0–100.0)
Patient temperature: 35.5
TCO2: 18 mmol/L (ref 0–100)
pH, Arterial: 7.004 — CL (ref 7.350–7.450)

## 2016-03-31 LAB — HEPARIN LEVEL (UNFRACTIONATED): HEPARIN UNFRACTIONATED: 0.21 [IU]/mL — AB (ref 0.30–0.70)

## 2016-03-31 LAB — POCT I-STAT 3, ART BLOOD GAS (G3+)
Acid-base deficit: 17 mmol/L — ABNORMAL HIGH (ref 0.0–2.0)
Acid-base deficit: 12 mmol/L — ABNORMAL HIGH (ref 0.0–2.0)
Acid-base deficit: 9 mmol/L — ABNORMAL HIGH (ref 0.0–2.0)
BICARBONATE: 14 meq/L — AB (ref 20.0–24.0)
BICARBONATE: 15.7 meq/L — AB (ref 20.0–24.0)
Bicarbonate: 18.1 mEq/L — ABNORMAL LOW (ref 20.0–24.0)
O2 SAT: 90 %
O2 Saturation: 87 %
O2 Saturation: 89 %
PCO2 ART: 43.9 mmHg (ref 35.0–45.0)
PCO2 ART: 43.2 mmHg (ref 35.0–45.0)
PH ART: 7.165 — AB (ref 7.350–7.450)
PO2 ART: 75 mmHg — AB (ref 80.0–100.0)
PO2 ART: 69 mmHg — AB (ref 80.0–100.0)
Patient temperature: 36.5
TCO2: 16 mmol/L (ref 0–100)
TCO2: 17 mmol/L (ref 0–100)
TCO2: 19 mmol/L (ref 0–100)
pCO2 arterial: 52.3 mmHg — ABNORMAL HIGH (ref 35.0–45.0)
pH, Arterial: 7.032 — CL (ref 7.350–7.450)
pH, Arterial: 7.229 — ABNORMAL LOW (ref 7.350–7.450)
pO2, Arterial: 74 mmHg — ABNORMAL LOW (ref 80.0–100.0)

## 2016-03-31 LAB — CBC
HCT: 37.8 % — ABNORMAL LOW (ref 39.0–52.0)
HEMATOCRIT: 33.7 % — AB (ref 39.0–52.0)
HEMOGLOBIN: 11.6 g/dL — AB (ref 13.0–17.0)
Hemoglobin: 12.5 g/dL — ABNORMAL LOW (ref 13.0–17.0)
MCH: 36.1 pg — AB (ref 26.0–34.0)
MCH: 37.3 pg — ABNORMAL HIGH (ref 26.0–34.0)
MCHC: 33.1 g/dL (ref 30.0–36.0)
MCHC: 34.4 g/dL (ref 30.0–36.0)
MCV: 108.4 fL — AB (ref 78.0–100.0)
MCV: 109.2 fL — AB (ref 78.0–100.0)
PLATELETS: 327 10*3/uL (ref 150–400)
PLATELETS: 551 10*3/uL — AB (ref 150–400)
RBC: 3.11 MIL/uL — AB (ref 4.22–5.81)
RBC: 3.46 MIL/uL — ABNORMAL LOW (ref 4.22–5.81)
RDW: 13 % (ref 11.5–15.5)
RDW: 13.7 % (ref 11.5–15.5)
WBC: 13.2 10*3/uL — AB (ref 4.0–10.5)
WBC: 15 10*3/uL — AB (ref 4.0–10.5)

## 2016-03-31 LAB — I-STAT TROPONIN, ED: Troponin i, poc: 1.3 ng/mL (ref 0.00–0.08)

## 2016-03-31 LAB — COMPREHENSIVE METABOLIC PANEL
ALT: 21 U/L (ref 17–63)
AST: 46 U/L — AB (ref 15–41)
Albumin: 3.4 g/dL — ABNORMAL LOW (ref 3.5–5.0)
Alkaline Phosphatase: 81 U/L (ref 38–126)
Anion gap: 15 (ref 5–15)
BUN: 23 mg/dL — ABNORMAL HIGH (ref 6–20)
CHLORIDE: 101 mmol/L (ref 101–111)
CO2: 16 mmol/L — ABNORMAL LOW (ref 22–32)
Calcium: 9.4 mg/dL (ref 8.9–10.3)
Creatinine, Ser: 1.72 mg/dL — ABNORMAL HIGH (ref 0.61–1.24)
GFR, EST AFRICAN AMERICAN: 43 mL/min — AB (ref >60)
GFR, EST NON AFRICAN AMERICAN: 37 mL/min — AB (ref >60)
Glucose, Bld: 337 mg/dL — ABNORMAL HIGH (ref 65–99)
POTASSIUM: 4.6 mmol/L (ref 3.5–5.1)
Sodium: 132 mmol/L — ABNORMAL LOW (ref 135–145)
Total Bilirubin: 0.7 mg/dL (ref 0.3–1.2)
Total Protein: 6.8 g/dL (ref 6.5–8.1)

## 2016-03-31 LAB — I-STAT CG4 LACTIC ACID, ED
Lactic Acid, Venous: 4.89 mmol/L (ref 0.5–1.9)
Lactic Acid, Venous: 8.37 mmol/L (ref 0.5–1.9)

## 2016-03-31 LAB — POCT I-STAT 3, VENOUS BLOOD GAS (G3P V)
ACID-BASE DEFICIT: 8 mmol/L — AB (ref 0.0–2.0)
Bicarbonate: 20.2 mEq/L (ref 20.0–24.0)
O2 SAT: 38 %
PCO2 VEN: 53.9 mmHg — AB (ref 45.0–50.0)
PH VEN: 7.182 — AB (ref 7.250–7.300)
PO2 VEN: 28 mmHg — AB (ref 31.0–45.0)
TCO2: 22 mmol/L (ref 0–100)

## 2016-03-31 LAB — CARBOXYHEMOGLOBIN
CARBOXYHEMOGLOBIN: 0.9 % (ref 0.5–1.5)
Methemoglobin: 0.8 % (ref 0.0–1.5)
O2 SAT: 92.7 %
Total hemoglobin: 12.8 g/dL — ABNORMAL LOW (ref 13.5–18.0)

## 2016-03-31 LAB — ECHOCARDIOGRAM COMPLETE
CHL CUP RV SYS PRESS: 50 mmHg
CHL CUP TV REG PEAK VELOCITY: 297 cm/s
FS: 8 % — AB (ref 28–44)
Height: 69 in
IV/PV OW: 0.59
LA diam end sys: 43 mm
LA vol index: 24.1 mL/m2
LADIAMINDEX: 2.43 cm/m2
LASIZE: 43 mm
LAVOL: 42.7 mL
LAVOLA4C: 46.8 mL
LDCA: 3.46 cm2
LVOTD: 21 mm
MV VTI: 56.9 cm
PW: 13 mm — AB (ref 0.6–1.1)
TR max vel: 297 cm/s
Weight: 2232.82 oz

## 2016-03-31 LAB — TROPONIN I
TROPONIN I: 10.57 ng/mL — AB (ref <0.03)
TROPONIN I: 2.54 ng/mL — AB (ref <0.03)

## 2016-03-31 LAB — URINE MICROSCOPIC-ADD ON

## 2016-03-31 LAB — LACTIC ACID, PLASMA
Lactic Acid, Venous: 4.9 mmol/L (ref 0.5–1.9)
Lactic Acid, Venous: 6.5 mmol/L (ref 0.5–1.9)

## 2016-03-31 LAB — POCT ACTIVATED CLOTTING TIME: ACTIVATED CLOTTING TIME: 114 s

## 2016-03-31 LAB — STREP PNEUMONIAE URINARY ANTIGEN: Strep Pneumo Urinary Antigen: NEGATIVE

## 2016-03-31 LAB — MRSA PCR SCREENING: MRSA by PCR: NEGATIVE

## 2016-03-31 LAB — PROCALCITONIN
PROCALCITONIN: 0.1 ng/mL
PROCALCITONIN: 2.96 ng/mL

## 2016-03-31 LAB — MAGNESIUM: MAGNESIUM: 2.6 mg/dL — AB (ref 1.7–2.4)

## 2016-03-31 LAB — PHOSPHORUS: PHOSPHORUS: 7 mg/dL — AB (ref 2.5–4.6)

## 2016-03-31 SURGERY — RIGHT/LEFT HEART CATH AND CORONARY ANGIOGRAPHY

## 2016-03-31 MED ORDER — SODIUM CHLORIDE 0.9 % IV SOLN: 250.0000 mL | INTRAVENOUS | Status: DC | PRN

## 2016-03-31 MED ORDER — SODIUM CHLORIDE 0.9% FLUSH
3.0000 mL | Freq: Two times a day (BID) | INTRAVENOUS | Status: DC
  Administered 2016-03-31 – 2016-04-01 (×2): 3 mL via INTRAVENOUS

## 2016-03-31 MED ORDER — SODIUM CHLORIDE 0.9% FLUSH: 3.0000 mL | INTRAVENOUS | Status: DC | PRN

## 2016-03-31 MED ORDER — VASOPRESSIN 20 UNIT/ML IV SOLN
0.0300 [IU]/min | INTRAVENOUS | Status: DC
  Administered 2016-03-31 – 2016-04-01 (×2): 0.03 [IU]/min via INTRAVENOUS
  Filled 2016-03-31 (×2): qty 2

## 2016-03-31 MED ORDER — ONDANSETRON HCL 4 MG/2ML IJ SOLN: 4.0000 mg | Freq: Four times a day (QID) | INTRAMUSCULAR | Status: DC | PRN

## 2016-03-31 MED ORDER — SODIUM BICARBONATE 8.4 % IV SOLN
50.0000 meq | Freq: Once | INTRAVENOUS | Status: AC
  Administered 2016-03-31: 50 meq via INTRAVENOUS

## 2016-03-31 MED ORDER — DEXTROSE 5 % IV SOLN
1.0000 g | Freq: Every day | INTRAVENOUS | Status: DC
  Administered 2016-03-31: 1 g via INTRAVENOUS
  Filled 2016-03-31 (×2): qty 10

## 2016-03-31 MED ORDER — HEPARIN BOLUS VIA INFUSION
2000.0000 [IU] | Freq: Once | INTRAVENOUS | Status: AC
  Administered 2016-03-31: 2000 [IU] via INTRAVENOUS
  Filled 2016-03-31: qty 2000

## 2016-03-31 MED ORDER — VANCOMYCIN HCL 500 MG IV SOLR
500.0000 mg | INTRAVENOUS | Status: DC
  Filled 2016-03-31: qty 500

## 2016-03-31 MED ORDER — SODIUM CHLORIDE 0.9 % IV SOLN
25.0000 ug/h | INTRAVENOUS | Status: DC
  Administered 2016-03-31: 50 ug/h via INTRAVENOUS
  Administered 2016-04-01: 250 ug/h via INTRAVENOUS
  Filled 2016-03-31 (×2): qty 50

## 2016-03-31 MED ORDER — DEXTROSE 5 % IV SOLN
500.0000 mg | Freq: Every day | INTRAVENOUS | Status: DC
  Administered 2016-03-31 (×2): 500 mg via INTRAVENOUS
  Filled 2016-03-31 (×3): qty 500

## 2016-03-31 MED ORDER — ACETAMINOPHEN 325 MG PO TABS: 650.0000 mg | ORAL_TABLET | ORAL | Status: DC | PRN

## 2016-03-31 MED ORDER — SODIUM CHLORIDE 0.9 % IV SOLN
INTRAVENOUS | Status: DC | PRN
  Administered 2016-03-31: 10 mL/h via INTRAVENOUS

## 2016-03-31 MED ORDER — VANCOMYCIN HCL IN DEXTROSE 1-5 GM/200ML-% IV SOLN
1000.0000 mg | Freq: Once | INTRAVENOUS | Status: DC
  Filled 2016-03-31: qty 200

## 2016-03-31 MED ORDER — SODIUM CHLORIDE 0.9 % FOR CRRT
INTRAVENOUS_CENTRAL | Status: DC | PRN
  Filled 2016-03-31: qty 1000

## 2016-03-31 MED ORDER — SODIUM CHLORIDE 0.9 % IV BOLUS (SEPSIS): 1000.0000 mL | Freq: Once | INTRAVENOUS | Status: DC

## 2016-03-31 MED ORDER — SODIUM CHLORIDE 0.9 % IV SOLN
INTRAVENOUS | Status: DC
  Administered 2016-03-31: 08:00:00 via INTRAVENOUS
  Filled 2016-03-31: qty 1000

## 2016-03-31 MED ORDER — ANTISEPTIC ORAL RINSE SOLUTION (CORINZ)
7.0000 mL | Freq: Four times a day (QID) | OROMUCOSAL | Status: DC
  Administered 2016-03-31: 7 mL via OROMUCOSAL

## 2016-03-31 MED ORDER — IOPAMIDOL (ISOVUE-370) INJECTION 76%
INTRAVENOUS | Status: AC
  Filled 2016-03-31: qty 100

## 2016-03-31 MED ORDER — HEPARIN (PORCINE) IN NACL 100-0.45 UNIT/ML-% IJ SOLN
1150.0000 [IU]/h | INTRAMUSCULAR | Status: DC
  Administered 2016-04-01: 900 [IU]/h via INTRAVENOUS
  Filled 2016-03-31: qty 250

## 2016-03-31 MED ORDER — HEPARIN SODIUM (PORCINE) 1000 UNIT/ML DIALYSIS
1000.0000 [IU] | INTRAMUSCULAR | Status: DC | PRN
  Administered 2016-03-31: 3000 [IU] via INTRAVENOUS_CENTRAL
  Filled 2016-03-31: qty 6
  Filled 2016-03-31: qty 3
  Filled 2016-03-31: qty 6

## 2016-03-31 MED ORDER — MIDAZOLAM HCL 2 MG/2ML IJ SOLN: 1.0000 mg | INTRAMUSCULAR | Status: DC | PRN

## 2016-03-31 MED ORDER — DEXTROSE 5 % IV SOLN
2.0000 g | Freq: Once | INTRAVENOUS | Status: AC
  Administered 2016-03-31: 2 g via INTRAVENOUS
  Filled 2016-03-31: qty 2

## 2016-03-31 MED ORDER — ASPIRIN 81 MG PO CHEW
81.0000 mg | CHEWABLE_TABLET | Freq: Every day | ORAL | Status: DC
Start: 2016-03-31 — End: 2016-04-01
  Administered 2016-03-31 – 2016-04-01 (×2): 81 mg
  Filled 2016-03-31 (×3): qty 1

## 2016-03-31 MED ORDER — KETAMINE HCL-SODIUM CHLORIDE 100-0.9 MG/10ML-% IV SOSY
100.0000 mg | PREFILLED_SYRINGE | Freq: Once | INTRAVENOUS | Status: DC
  Filled 2016-03-31: qty 10

## 2016-03-31 MED ORDER — SODIUM BICARBONATE 8.4 % IV SOLN
INTRAVENOUS | Status: AC
  Filled 2016-03-31: qty 50

## 2016-03-31 MED ORDER — LIDOCAINE HCL (PF) 1 % IJ SOLN
INTRAMUSCULAR | Status: AC
  Filled 2016-03-31: qty 30

## 2016-03-31 MED ORDER — CHLORHEXIDINE GLUCONATE 0.12% ORAL RINSE (MEDLINE KIT)
15.0000 mL | Freq: Two times a day (BID) | OROMUCOSAL | Status: DC
  Administered 2016-03-31 (×2): 15 mL via OROMUCOSAL

## 2016-03-31 MED ORDER — KETAMINE HCL 10 MG/ML IJ SOLN
INTRAMUSCULAR | Status: AC | PRN
  Administered 2016-03-31: 100 mg via INTRAVENOUS

## 2016-03-31 MED ORDER — SODIUM CHLORIDE 0.9 % IV BOLUS (SEPSIS)
1000.0000 mL | Freq: Once | INTRAVENOUS | Status: AC
  Administered 2016-03-31: 1000 mL via INTRAVENOUS

## 2016-03-31 MED ORDER — LIDOCAINE HCL (PF) 1 % IJ SOLN
INTRAMUSCULAR | Status: DC | PRN
Start: 2016-03-31 — End: 2016-03-31
  Administered 2016-03-31 (×2): 15 mL

## 2016-03-31 MED ORDER — SODIUM CHLORIDE 0.9% FLUSH
3.0000 mL | Freq: Two times a day (BID) | INTRAVENOUS | Status: DC
  Administered 2016-03-31: 3 mL via INTRAVENOUS

## 2016-03-31 MED ORDER — SODIUM BICARBONATE 8.4 % IV SOLN
INTRAVENOUS | Status: DC
  Administered 2016-03-31 – 2016-04-01 (×4): via INTRAVENOUS
  Filled 2016-03-31 (×10): qty 150

## 2016-03-31 MED ORDER — HEPARIN (PORCINE) IN NACL 2-0.9 UNIT/ML-% IJ SOLN
INTRAMUSCULAR | Status: AC
  Filled 2016-03-31: qty 1000

## 2016-03-31 MED ORDER — DEXTROSE 5 % IV SOLN
0.0000 ug/min | Freq: Once | INTRAVENOUS | Status: AC
  Administered 2016-03-31: 10 ug/min via INTRAVENOUS
  Filled 2016-03-31: qty 4

## 2016-03-31 MED ORDER — SODIUM CHLORIDE 0.9 % IV SOLN
INTRAVENOUS | Status: DC
  Administered 2016-03-31: 6.1 [IU]/h via INTRAVENOUS
  Administered 2016-03-31: 2.5 [IU]/h via INTRAVENOUS
  Filled 2016-03-31 (×2): qty 2.5

## 2016-03-31 MED ORDER — IOPAMIDOL (ISOVUE-370) INJECTION 76%
INTRAVENOUS | Status: DC | PRN
  Administered 2016-03-31: 50 mL via INTRAVENOUS

## 2016-03-31 MED ORDER — FENTANYL CITRATE (PF) 100 MCG/2ML IJ SOLN
50.0000 ug | Freq: Once | INTRAMUSCULAR | Status: AC
  Administered 2016-03-31: 50 ug via INTRAVENOUS

## 2016-03-31 MED ORDER — IPRATROPIUM-ALBUTEROL 0.5-2.5 (3) MG/3ML IN SOLN
3.0000 mL | Freq: Four times a day (QID) | RESPIRATORY_TRACT | Status: DC
  Administered 2016-03-31 – 2016-04-01 (×5): 3 mL via RESPIRATORY_TRACT
  Filled 2016-03-31 (×6): qty 3

## 2016-03-31 MED ORDER — SODIUM POLYSTYRENE SULFONATE 15 GM/60ML PO SUSP
30.0000 g | Freq: Once | ORAL | Status: AC
  Administered 2016-03-31: 30 g via ORAL
  Filled 2016-03-31: qty 120

## 2016-03-31 MED ORDER — PRISMASOL BGK 4/2.5 32-4-2.5 MEQ/L IV SOLN
INTRAVENOUS | Status: DC
  Filled 2016-03-31 (×11): qty 5000

## 2016-03-31 MED ORDER — DEXTROSE 5 % IV SOLN
2.0000 g | Freq: Two times a day (BID) | INTRAVENOUS | Status: DC
  Administered 2016-03-31: 2 g via INTRAVENOUS
  Filled 2016-03-31 (×3): qty 2

## 2016-03-31 MED ORDER — SODIUM CHLORIDE 0.9 % IJ SOLN
250.0000 [IU]/h | INTRAMUSCULAR | Status: DC
  Filled 2016-03-31: qty 2

## 2016-03-31 MED ORDER — SODIUM CHLORIDE 0.9% FLUSH
3.0000 mL | Freq: Two times a day (BID) | INTRAVENOUS | Status: DC
  Administered 2016-03-31 (×2): 3 mL via INTRAVENOUS

## 2016-03-31 MED ORDER — SODIUM CHLORIDE 0.9 % IV SOLN
INTRAVENOUS | Status: DC
  Administered 2016-03-31: 05:00:00 via INTRAVENOUS

## 2016-03-31 MED ORDER — ENOXAPARIN SODIUM 40 MG/0.4ML ~~LOC~~ SOLN: 40.0000 mg | SUBCUTANEOUS | Status: DC

## 2016-03-31 MED ORDER — FAMOTIDINE IN NACL 20-0.9 MG/50ML-% IV SOLN
20.0000 mg | Freq: Two times a day (BID) | INTRAVENOUS | Status: DC
  Administered 2016-03-31: 20 mg via INTRAVENOUS
  Filled 2016-03-31 (×3): qty 50

## 2016-03-31 MED ORDER — PRISMASOL BGK 4/2.5 32-4-2.5 MEQ/L IV SOLN
INTRAVENOUS | Status: DC
  Filled 2016-03-31 (×9): qty 5000

## 2016-03-31 MED ORDER — SODIUM CHLORIDE 0.9 % IV SOLN
INTRAVENOUS | Status: DC
  Administered 2016-04-01: via INTRAVENOUS

## 2016-03-31 MED ORDER — HEPARIN BOLUS VIA INFUSION (CRRT)
1000.0000 [IU] | INTRAVENOUS | Status: DC | PRN
  Filled 2016-03-31: qty 1000

## 2016-03-31 MED ORDER — DOCUSATE SODIUM 50 MG/5ML PO LIQD
100.0000 mg | Freq: Two times a day (BID) | ORAL | Status: DC | PRN
  Filled 2016-03-31: qty 10

## 2016-03-31 MED ORDER — METHYLPREDNISOLONE SODIUM SUCC 40 MG IJ SOLR
40.0000 mg | Freq: Three times a day (TID) | INTRAMUSCULAR | Status: DC
  Administered 2016-03-31: 40 mg via INTRAVENOUS
  Filled 2016-03-31 (×2): qty 1

## 2016-03-31 MED ORDER — NOREPINEPHRINE BITARTRATE 1 MG/ML IV SOLN
2.0000 ug/min | INTRAVENOUS | Status: DC
  Administered 2016-03-31: 25 ug/min via INTRAVENOUS
  Filled 2016-03-31: qty 4

## 2016-03-31 MED ORDER — ANTISEPTIC ORAL RINSE SOLUTION (CORINZ)
7.0000 mL | OROMUCOSAL | Status: DC
  Administered 2016-04-01 (×6): 7 mL via OROMUCOSAL

## 2016-03-31 MED ORDER — LEVALBUTEROL HCL 0.63 MG/3ML IN NEBU: 0.6300 mg | INHALATION_SOLUTION | RESPIRATORY_TRACT | Status: DC | PRN

## 2016-03-31 MED ORDER — INSULIN ASPART 100 UNIT/ML ~~LOC~~ SOLN: 0.0000 [IU] | SUBCUTANEOUS | Status: DC

## 2016-03-31 MED ORDER — CHLORHEXIDINE GLUCONATE 0.12% ORAL RINSE (MEDLINE KIT)
15.0000 mL | Freq: Two times a day (BID) | OROMUCOSAL | Status: DC
  Administered 2016-03-31 – 2016-04-01 (×2): 15 mL via OROMUCOSAL

## 2016-03-31 MED ORDER — ASPIRIN 81 MG PO CHEW
81.0000 mg | CHEWABLE_TABLET | ORAL | Status: AC
  Administered 2016-04-01: 81 mg via ORAL

## 2016-03-31 MED ORDER — PRISMASOL BGK 4/2.5 32-4-2.5 MEQ/L IV SOLN
INTRAVENOUS | Status: DC
  Filled 2016-03-31 (×25): qty 5000

## 2016-03-31 MED ORDER — HEPARIN (PORCINE) IN NACL 2-0.9 UNIT/ML-% IJ SOLN
INTRAMUSCULAR | Status: DC | PRN
  Administered 2016-03-31: 1000 mL

## 2016-03-31 MED ORDER — DEXTROSE-NACL 5-0.45 % IV SOLN
INTRAVENOUS | Status: DC
  Administered 2016-04-01: 07:00:00 via INTRAVENOUS

## 2016-03-31 MED ORDER — DEXTROSE 50 % IV SOLN: 25.0000 mL | INTRAVENOUS | Status: DC | PRN

## 2016-03-31 MED ORDER — HEPARIN (PORCINE) IN NACL 100-0.45 UNIT/ML-% IJ SOLN
900.0000 [IU]/h | INTRAMUSCULAR | Status: DC
Start: 2016-03-31 — End: 2016-03-31
  Administered 2016-03-31: 750 [IU]/h via INTRAVENOUS
  Filled 2016-03-31: qty 250

## 2016-03-31 MED ORDER — INSULIN REGULAR BOLUS VIA INFUSION
0.0000 [IU] | Freq: Three times a day (TID) | INTRAVENOUS | Status: DC
  Filled 2016-03-31: qty 10

## 2016-03-31 MED ORDER — METHYLPREDNISOLONE SODIUM SUCC 125 MG IJ SOLR
80.0000 mg | Freq: Three times a day (TID) | INTRAMUSCULAR | Status: DC
  Administered 2016-03-31 – 2016-04-01 (×2): 80 mg via INTRAVENOUS
  Filled 2016-03-31 (×2): qty 2

## 2016-03-31 MED ORDER — PRAVASTATIN SODIUM 40 MG PO TABS
20.0000 mg | ORAL_TABLET | Freq: Every day | ORAL | Status: DC
  Filled 2016-03-31: qty 1

## 2016-03-31 MED ORDER — DEXTROSE 5 % IV SOLN
2.0000 ug/min | INTRAVENOUS | Status: DC
  Administered 2016-03-31: 28 ug/min via INTRAVENOUS
  Administered 2016-03-31: 25 ug/min via INTRAVENOUS
  Administered 2016-03-31: 34 ug/min via INTRAVENOUS
  Administered 2016-04-01: 37 ug/min via INTRAVENOUS
  Administered 2016-04-01: 38 ug/min via INTRAVENOUS
  Filled 2016-03-31 (×6): qty 16

## 2016-03-31 MED ORDER — SODIUM BICARBONATE 8.4 % IV SOLN: 100.0000 meq | Freq: Once | INTRAVENOUS | Status: DC

## 2016-03-31 MED ORDER — FAMOTIDINE IN NACL 20-0.9 MG/50ML-% IV SOLN
20.0000 mg | INTRAVENOUS | Status: DC
  Administered 2016-04-01: 20 mg via INTRAVENOUS
  Filled 2016-03-31 (×2): qty 50

## 2016-03-31 MED ORDER — PIPERACILLIN-TAZOBACTAM 3.375 G IVPB 30 MIN
3.3750 g | Freq: Once | INTRAVENOUS | Status: AC
  Administered 2016-03-31: 3.375 g via INTRAVENOUS
  Filled 2016-03-31: qty 50

## 2016-03-31 MED ORDER — SODIUM CHLORIDE 0.9 % IV SOLN: INTRAVENOUS | Status: DC

## 2016-03-31 MED ORDER — ROCURONIUM BROMIDE 50 MG/5ML IV SOLN
INTRAVENOUS | Status: AC | PRN
  Administered 2016-03-31: 100 mg via INTRAVENOUS

## 2016-03-31 MED ORDER — FENTANYL BOLUS VIA INFUSION
25.0000 ug | INTRAVENOUS | Status: DC | PRN
  Filled 2016-03-31: qty 25

## 2016-03-31 MED ORDER — VANCOMYCIN HCL 10 G IV SOLR
1250.0000 mg | Freq: Once | INTRAVENOUS | Status: AC
  Administered 2016-03-31: 1250 mg via INTRAVENOUS
  Filled 2016-03-31: qty 1250

## 2016-03-31 SURGICAL SUPPLY — 22 items
CATH EXPO 5F MPA-1 (CATHETERS) ×3 IMPLANT
CATH INFINITI 5FR MULTPACK ANG (CATHETERS) ×3 IMPLANT
CATH SWAN GANZ 7F STRAIGHT (CATHETERS) ×3 IMPLANT
COVER PRB 48X5XTLSCP FOLD TPE (BAG) ×1 IMPLANT
COVER PROBE 5X48 (BAG) ×2
ELECT DEFIB PAD ADLT CADENCE (PAD) ×3 IMPLANT
KIT HEART LEFT (KITS) ×3 IMPLANT
KIT HEART RIGHT NAMIC (KITS) ×3 IMPLANT
PACK CARDIAC CATHETERIZATION (CUSTOM PROCEDURE TRAY) ×3 IMPLANT
PINNACLE LONG 5F 25CM (SHEATH) ×3
SET INTRODUCER MICROPUNCT 5F (INTRODUCER) ×6 IMPLANT
SHEATH INTROD PINNACLE 5F 25CM (SHEATH) ×1 IMPLANT
SHEATH PINNACLE 5F 10CM (SHEATH) ×9 IMPLANT
SHEATH PINNACLE 7F 10CM (SHEATH) ×3 IMPLANT
SLEEVE REPOSITIONING LENGTH 30 (MISCELLANEOUS) ×3 IMPLANT
TRANSDUCER W/STOPCOCK (MISCELLANEOUS) ×3 IMPLANT
TUBING CIL FLEX 10 FLL-RA (TUBING) IMPLANT
WIRE EMERALD 3MM-J .025X260CM (WIRE) ×3 IMPLANT
WIRE EMERALD 3MM-J .035X150CM (WIRE) ×6 IMPLANT
WIRE HI TORQ VERSACORE-J 145CM (WIRE) ×3 IMPLANT
WIRE MICROINTRODUCER 60CM (WIRE) ×3 IMPLANT
WIRE SAFE-T 1.5MM-J .035X260CM (WIRE) ×3 IMPLANT

## 2016-03-31 NOTE — Consult Note (Signed)
  Cardiothoracic Surgery    Called by Dr. Clifton James to discuss treatment options for this critically ill patient in the cath lab. He is a 75 year old gentleman with COPD, HTN and HLD who was admitted last pm with acute respiratory failure requiring intubation. CXR showed pulmonary edema. He had an elevated lactate and elevated troponin that rose to >65. He has been in shock today on levophed up to 36 mcg and vasopressin. He has been acidotic and anuric with rising creatinine. Echo shows an EF of 10-15% with global hypokinesis. 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 Distal vessels are ok. He has severe calcification and stenosis of the iliac and femoral system precluding use of Impella or IABP. I think he is too poor of condition to get through emergent CABG with severe and progressive multi-system organ failure at his age. I would recommend palliative care.

## 2016-03-31 NOTE — Progress Notes (Signed)
RT NOTE:  Pt being transported to 24M by Pieter Partridge, RT. Report also given to RT.

## 2016-03-31 NOTE — Progress Notes (Signed)
abg results reviewed with Dr Pollie Friar, will increase sodium bicarb drip per md order.

## 2016-03-31 NOTE — H&P (View-Only) (Signed)
Note: Patient is Intubated. No family by beside. History was obtained by chart review.   Patient ID: Thomas Mendoza MRN: [id number], DOB/AGE: [birth year]   Admit date: 03/29/2016   Reason for Consult: Elevated Troponin; ? Cardiogenic Shock Requesting MD: Dr. Titus Mould, Critical Care    Primary Physician: Robyn Haber, MD Primary Cardiologist: New   Pt. Profile:  75 y/o male with h/o COPD, HTN, and HLD admitted for acute respiratory failure requiring intubation. Elevated lactate (8.37), elevated troponin (2.54>>10.57>> >65) and new LBBB.   Problem List  Past Medical History:  Diagnosis Date  . Depression   . Emphysema of lung ([place])   . Hyperlipidemia   . Hypertension     Past Surgical History:  Procedure Laterality Date  . APPENDECTOMY    . EYE SURGERY       Allergies  No Known Allergies  HPI  75 y/o male with h/o COPD, HTN, and HLD admitted for acute respiratory failure requiring intubation. He was found to have an elevated lactate level at 8.37. Initial troponin was abnormal at 2.54. This has risen significantly to 10.57 to >65. He also has a new LBBB on EKG. Patient remains intubated and no family is currently present at beside, thus history is limited. Unsure if any recent CP. What can be gathered from H&P is that the patient complained of acute onset of dyspnea prior to arrival to hospital by EMS. No reports of CP outlined in H&P. 2D echo is pending. He is sinus tach on telemetry. CXRs with findings of suspected pulmonary edema with worsening perihilar opacities with differential considerations include atelectasis, worsening alveolar pulmonary edema and aspiration.  He also has acute renal failure with rising SCr now at 2.42. He is on antibiotics, IV heparin, levophed and vasopressin.   Home Medications  Prior to Admission medications   Medication Sig Start Date End Date Taking? Authorizing Provider  aspirin 81 MG tablet Take 81 mg by mouth at bedtime.       Historical Provider, MD  citalopram (CELEXA) 40 MG tablet TAKE 1 TABLET (40 MG TOTAL) BY MOUTH DAILY. 05/16/15   Robyn Haber, MD  clonazePAM (KLONOPIN) 1 MG tablet TAKE 1 TABLET BY MOUTH AT BEDTIME AS NEEDED FOR ANXIETY 09/30/15   Robyn Haber, MD  lisinopril (PRINIVIL,ZESTRIL) 10 MG tablet Take 1 tablet (10 mg total) by mouth daily. 05/16/15   Robyn Haber, MD  pravastatin (PRAVACHOL) 20 MG tablet Take 1 tablet (20 mg total) by mouth daily. 05/16/15   Robyn Haber, MD  predniSONE (DELTASONE) 20 MG tablet Two daily with food Patient not taking: Reported on 05/16/2015 01/11/15   Robyn Haber, MD  tiotropium (SPIRIVA HANDIHALER) 18 MCG inhalation capsule Place 1 capsule (18 mcg total) into inhaler and inhale 2 (two) times daily. 09/30/15   Robyn Haber, MD  zoster vaccine live, PF, (ZOSTAVAX) 09233 UNT/0.65ML injection Inject 19,400 Units into the skin once. 05/29/14   Robyn Haber, MD    Hospital Meds . antiseptic oral rinse  7 mL Mouth Rinse QID  . aspirin  81 mg Per Tube Daily  . azithromycin  500 mg Intravenous QHS  . cefTAZidime (FORTAZ)  IV  2 g Intravenous Q12H  . chlorhexidine gluconate (SAGE KIT)  15 mL Mouth Rinse BID  . [START ON 04/20/16] famotidine (PEPCID) IV  20 mg Intravenous Q24H  . insulin regular  0-10 Units Intravenous TID WC  . ipratropium-albuterol  3 mL Nebulization Q6H  . methylPREDNISolone (SOLU-MEDROL) injection  80 mg Intravenous Q8H  .  pravastatin  20 mg Per Tube q1800  . sodium chloride flush  3 mL Intravenous Q12H  . [START ON 04-11-16] vancomycin  500 mg Intravenous Q24H   . dextrose 5 % and 0.45% NaCl    . fentaNYL infusion INTRAVENOUS 50 mcg/hr (03/26/2016 0951)  . heparin 10,000 units/ 20 mL infusion syringe    . heparin 900 Units/hr (03/25/2016 1201)  . insulin (NOVOLIN-R) infusion 7.8 Units/hr (03/12/2016 1207)  . norepinephrine (LEVOPHED) Adult infusion 28 mcg/min (03/27/2016 1209)  . dialysis replacement fluid (prismasate)    . dialysis  replacement fluid (prismasate)    . dialysate (PRISMASATE)    .  sodium bicarbonate  infusion 1000 mL 125 mL/hr at 03/27/2016 0754  . vasopressin (PITRESSIN) infusion - *FOR SHOCK* 0.03 Units/min (03/20/2016 1113)    Family History  Family History  Problem Relation Age of Onset  . COPD Father   . Cancer Father   . Alcohol abuse Father     Social History  Social History   Social History  . Marital status: Married    Spouse name: N/A  . Number of children: N/A  . Years of education: N/A   Occupational History  . Not on file.   Social History Main Topics  . Smoking status: Current Every Day Smoker    Packs/day: 1.00    Types: Cigarettes  . Smokeless tobacco: Current User  . Alcohol use No  . Drug use: No  . Sexual activity: Yes     Comment: number of sex partners in the last 12 months  1   Other Topics Concern  . Not on file   Social History Narrative   Exercise walking     Review of Systems General:  No chills, fever, night sweats or weight changes.  Cardiovascular:  No chest pain, dyspnea on exertion, edema, orthopnea, palpitations, paroxysmal nocturnal dyspnea. Dermatological: No rash, lesions/masses Respiratory: No cough, dyspnea Urologic: No hematuria, dysuria Abdominal:   No nausea, vomiting, diarrhea, bright red blood per rectum, melena, or hematemesis Neurologic:  No visual changes, wkns, changes in mental status. All other systems reviewed and are otherwise negative except as noted above.  Physical Exam  Blood pressure 91/66, pulse (!) 131, temperature 98.8 F (37.1 C), resp. rate (!) 30, height 5' 9"  (1.753 m), weight 139 lb 8.8 oz (63.3 kg), SpO2 92 %.  General: Intubated, bear hugger Psych: Intubated Neuro: Alert and oriented X 3. Moves all extremities spontaneously. HEENT: Normal  Neck: Supple without bruits or JVD. Lungs:  Intubated, faint rhonchi anteriorly  Heart: reg rhythm, tachy rate no s3, s4, or murmurs. Abdomen: Soft, non-tender,  non-distended, BS + x 4.  Extremities: No clubbing, cyanosis or edema. DP/PT/Radials 2+ and equal bilaterally.  Labs  Troponin [facility] Test)  Recent Labs  03/21/2016 0001  TROPIPOC 1.30*    Recent Labs  03/13/2016 2341 03/15/2016 0520  TROPONINI 2.54* 10.57*   Lab Results  Component Value Date   WBC 15.0 (H) 03/18/2016   HGB 11.6 (L) 03/22/2016   HCT 33.7 (L) 03/27/2016   MCV 108.4 (H) 03/17/2016   PLT 551 (H) 03/20/2016    Recent Labs Lab 04/04/2016 2341  03/26/2016 0935  NA 132*  < > 135  K 4.6  < > 4.7  CL 101  < > 105  CO2 16*  < > 16*  BUN 23*  < > 27*  CREATININE 1.72*  < > 2.42*  CALCIUM 9.4  < > 7.9*  PROT 6.8  --   --  BILITOT 0.7  --   --   ALKPHOS 81  --   --   ALT 21  --   --   AST 46*  --   --   GLUCOSE 337*  < > 304*  < > = values in this interval not displayed. Lab Results  Component Value Date   CHOL 140 05/16/2015   HDL 34 (L) 05/16/2015   LDLCALC 79 05/16/2015   TRIG 137 05/16/2015   No results found for: DDIMER   Radiology/Studies  Dg Chest Portable 1 View  Result Date: 04/02/2016 CLINICAL DATA:  75 year old male with central line placement. EXAM: PORTABLE CHEST 1 VIEW COMPARISON:  Chest radiograph dated 04/04/2016 FINDINGS: There has been interval placement of a left subclavian central line with tip over central SVC. The endotracheal and enteric tubes are in stable positioning. Emphysematous changes of the lungs with bilateral patchy ground-glass densities similar to prior study. The cardiac silhouette is within normal limits. No acute osseous pathology. IMPRESSION: Interval placement of a left subclavian central venous catheter with tip over central SVC. No pneumothorax. Other support devices in stable positioning. Emphysema with bilateral patchy airspace densities as seen on the prior study. Follow-up recommended. Electronically Signed   By: Anner Crete M.D.   On: 03/20/2016 02:00  Dg Chest Portable 1 View  Result Date:  03/16/2016 CLINICAL DATA:  Intubation EXAM: PORTABLE CHEST 1 VIEW COMPARISON:  03/07/2016; 01/11/2015; 05/29/2014 FINDINGS: Examination is degraded secondary to exclusion of the bilateral costophrenic angles. The endotracheal tube overlies the tracheal air column with tip approximately 2.4 cm above the carina. Enteric tube tip and side port project below the midline of the lower thorax. No definite pneumothorax. Grossly unchanged cardiac silhouette and mediastinal contours. Atherosclerotic plaque within thoracic aorta. The pulmonary vasculature is indistinct with cephalization of flow. Worsening perihilar heterogeneous airspace opacities. No definite pleural effusion, though note, the bilateral costophrenic angles are excluded from view. Unchanged bones. IMPRESSION: 1. Endotracheal tube tip overlies the tracheal air column, approximately 2.4 cm above the carina. No pneumothorax. 2. Enteric tube projects below the midline of the lower thorax, tip excluded from view. 3. Similar findings of suspected pulmonary edema with worsening perihilar opacities with differential considerations include atelectasis, worsening alveolar pulmonary edema and aspiration. 4.  Aortic Atherosclerosis (ICD10-170.0) Electronically Signed   By: Sandi Mariscal M.D.   On: 03/19/2016 00:40  Dg Chest Portable 1 View  Result Date: 03/06/2016 CLINICAL DATA:  75 year old male with shortness of breath EXAM: PORTABLE CHEST 1 VIEW COMPARISON:  Chest radiograph dated 01/11/2015 FINDINGS: Single portable view of the chest demonstrates emphysematous changes of the lungs with interstitial coarsening, progressed compared to prior study. Areas of hazy density in the perihilar region with Kerley B-lines suggest mild congestion and edema. Superimposed pneumonia is not excluded. Clinical correlation is recommended. There is no focal consolidation. No pneumothorax. The cardiac silhouette is within normal limits with no acute osseous pathology. IMPRESSION: Mild  pulmonary vascular congestion and interstitial edema on the background of emphysema. Pneumonia is not excluded. Clinical correlation is recommended. Electronically Signed   By: Anner Crete M.D.   On: 03/29/2016 00:11   ECG  Sinus tach w/ new LBBB  Echocardiogram- pending   ASSESSMENT AND PLAN  Active Problems:   Respiratory failure ([place])   1. Acute Respiratory Failure: cardiogenic shock is a likely etiology given troponin of >65 and new LBBB on EKG. History is limited by patient status, as he remains intubated. Unsure of any recent anginal pain.  2D echo pending to assess LVF, wall motion, the pericardium and valve anatomy. He has worsening renal failure 2/2 shock, now with SCr at 2.42. He will likely need LHC to r/o CAD. Will f/u on echo results. Continue IV heparin. MD to follow with further recommendations.    Signed, Lyda Jester, PA-C 03/18/2016, 12:57 PM  Patient seen with PA, agree with the above note.  Patient has history of COPD.  He developed dyspnea at home last night.  He came to the ER with respiratory arrest, hypoxemia and developed hypotension.  DDx initially AECOPD versus PNA vs PE vs MI.  Initial troponin 2, rose to >65 through the day today.  He has become progressively hypotensive.  He is intubated.  He is oliguric and CVVH is being prepared, creatinine up to 2.42.  1. Acute MI: LBBB on ECG, ?new, not on last ECG in 2015.  Troponin up to > 65.  Echo done, EF 10%.  Unsure if first event was respiratory-related (acute exacerbation of COPD/PNA) followed by MI or was all related to MI.  Currently suspect cardiogenic shock.  - Discussed with patient's wife and grandson, plan to take him to cath lab for angiography and placement of Impella.  She understands the considerable risk of worsening renal function and agrees for him to have procedure. If PCI is possible, will undertake.  - He has had ASA. 2. Shock: Suspect cardiogenic shock more likely than septic shock based  on EF 10% on echo.  He is currently on vasopressin and norepinephrine with SBP in upper 80s currently.  - I think that he will need mechanical circulatory support, will place Impella in cath lab prior to angiography/PCI.  3. AKI: Oliguric in setting of shock.  CVVH planned.  Family understands risk of worsening renal situation from coronary angigoraphy.  4. ID: WBCs elevated, coverning with broad spectrum antibiotics for PNA . 5. COPD: ?component of COPD exacerbation.   Loralie Champagne 03/20/2016 2:12 PM ]

## 2016-03-31 NOTE — Procedures (Signed)
Arterial Catheter Insertion Procedure Note Thomas Mendoza [id number] 1941-06-15  Procedure: Insertion of Arterial Catheter  Indications: Blood pressure monitoring and Frequent blood sampling  Procedure Details Consent: Risks of procedure as well as the alternatives and risks of each were explained to the (patient/caregiver).  Consent for procedure obtained. Time Out: Verified patient identification, verified procedure, site/side was marked, verified correct patient position, special equipment/implants available, medications/allergies/relevent history reviewed, required imaging and test results available.  Performed  Maximum sterile technique was used including antiseptics, cap, gloves, gown, hand hygiene, mask and sheet. Skin prep: Chlorhexidine; local anesthetic administered 20 gauge catheter was inserted into left femoral artery using the Seldinger technique.  Evaluation Blood flow good; BP tracing good. Complications: No apparent complications.   Nelda Bucks 2016/04/03  [place]  Mcarthur Rossetti. [name], MD, FACP Pgr: [phone] [facility]

## 2016-03-31 NOTE — Progress Notes (Signed)
Pt arrived to unit orally intubated calm and sedated, fentanyl drip restarted at a rate of 10 mcg/hr. MAP > 65, levophed drip at 25 mcg/hr. Hr 120s, heparin drip 750 units/hr. Foley with 5cc pink tinged urine

## 2016-03-31 NOTE — ED Notes (Signed)
Paged Critical Care to [place]

## 2016-03-31 NOTE — Progress Notes (Signed)
eLink Physician-Brief Progress Note Patient Name: Thomas Mendoza DOB: Sep 08, 1940 MRN: [id number]   Date of Service  03/30/2016  HPI/Events of Note  fsbs>400  eICU Interventions  willstart insulin sub q therapy, if not improving then will start insulin therapy     Intervention Category Major Interventions: Hyperglycemia - active titration of insulin therapy  Erin Fulling 03/05/2016, 3:22 AM

## 2016-03-31 NOTE — Progress Notes (Signed)
RT NOTE:   Pt arrived by EMS wearing CPAP.  RT switched patient to BIPAP 15/5, 60%. Critical ABG reported to MD. Pt at this time feels no relief from BIPAP. MD prepared patient for intubation. RT assisted throughout. RT will monitor.

## 2016-03-31 NOTE — Progress Notes (Signed)
  Pharmacy Antibiotic Note  Thomas Mendoza is a 75 y.o. male admitted on 04/04/2016 with Shortness of breath.  Pharmacy has been consulted for ceftazidime and vancomycin dosing for possible HCAP. Lactic acid trend: 8.4, 4.9, 4.9, 6.5 and procalcitonin 2.96.Patient expected being initiated on CRRT.  Plan: Azithriomycin 500mg  qd Ceftazidime 2G q12h Vancomycin 500mg  q24  Monitor:  Renal function and pending cultures Vanc random as needed  Height: 5\' 9"  (175.3 cm) Weight: 139 lb 8.8 oz (63.3 kg) IBW/kg (Calculated) : 70.7  Temp (24hrs), Avg:96.6 F (35.9 C), Min:93.1 F (33.9 C), Max:97.9 F (36.6 C)   Recent Labs Lab 03/25/2016 2341 03/17/2016 0003 03/06/2016 0216 04/03/2016 0226 04/04/2016 0520 03/17/2016 0935  WBC 13.2*  --   --   --  15.0*  --   CREATININE 1.72*  --   --   --  1.96* 2.42*  LATICACIDVEN  --  8.37* 4.9* 4.89* 6.5*  --     Estimated Creatinine Clearance: 24 mL/min (by C-G formula based on SCr of 2.42 mg/dL).    No Known Allergies  Antimicrobials this admission: 7/27 Zosyn x 1 7/27 Rocephin x1 7/27 Azith >> 7/27 Ceftaz>> 7/27 Vanc >>  Dose adjustments this admission: n/a  Microbiology results: 7/26 BCx x2: pending 7/26 UCx:  pending 7/27 MRSA PCR: Negative  Thank you for allowing pharmacy to be a part of this patient's care.  Ruben Im PharmD 03/10/2016

## 2016-03-31 NOTE — Progress Notes (Signed)
ANTICOAGULATION CONSULT NOTE - Initial Consult  Pharmacy Consult for heparin Indication: chest pain/ACS  No Known Allergies  Patient Measurements: Height: 5\' 9"  (175.3 cm) Weight: 143 lb (64.9 kg) IBW/kg (Calculated) : 70.7  Vital Signs: Temp: 93.1 F (33.9 C) (07/27 0001) Temp Source: Rectal (07/27 0001) BP: 97/74 (07/27 0145) Pulse Rate: 122 (07/27 0145)  Labs:  Recent Labs  2016-04-29 2341  HGB 12.5*  HCT 37.8*  PLT 327  CREATININE 1.72*  TROPONINI 2.54*    Estimated Creatinine Clearance: 34.6 mL/min (by C-G formula based on SCr of 1.72 mg/dL).   Medical History: Past Medical History:  Diagnosis Date  . Depression   . Emphysema of lung ([facility])   . Hyperlipidemia   . Hypertension     Assessment: 74yo male presents w/ respiratory distress, tx'd w/ ABX for CAP, now also w/ elevated troponin, to begin heparin.  Goal of Therapy:  Heparin level 0.3-0.7 units/ml Monitor platelets by anticoagulation protocol: Yes   Plan:  Will give heparin 2000 units x1 followed by gtt at 750 units/hr and monitor heparin levels and CBC.  Vernard Gambles, PharmD, BCPS  03/08/2016,2:06 AM

## 2016-03-31 NOTE — ED Provider Notes (Signed)
INTUBATION Performed by: Tery Sanfilippo  Required items: required blood products, implants, devices, and special equipment available Patient identity confirmed: provided demographic data and hospital-assigned identification number Time out: Immediately prior to procedure a "time out" was called to verify the correct patient, procedure, equipment, support staff and site/side marked as required.  Indications: Sepsis with tachypnea and acidosis.    Intubation method: Direct Laryngoscopy   Preoxygenation: BVM  Sedatives: Ketamine Paralytic: Rocuronium  Tube Size: 7.5 cuffed  Post-procedure assessment: chest rise and ETCO2 monitor Breath sounds: equal and absent over the epigastrium Tube secured with: ETT holder Chest x-ray interpreted by radiologist and me.  Chest x-ray findings: endotracheal tube in appropriate position  Patient tolerated the procedure well with no immediate complications.     EMERGENCY DEPARTMENT [place] CARDIAC EXAM "Study: Limited Ultrasound of the heart and pericardium"  INDICATIONS:Unstable Vital Signs Multiple views of the heart and pericardium are obtained with a multi-frequency probe.  PERFORMED EA:KLTYVD  IMAGES ARCHIVED?: Yes  FINDINGS: No pericardial effusion, Hyperdynamic contractility, Decreased contractility, IVC dilated and Tamponade physiology absent  LIMITATIONS:  Body habitus and Emergent procedure  VIEWS USED: Apical 4 chamber  and Inferior Vena Cava  INTERPRETATION: Cardiac activity present, Pericardial effusioin absent, Cardiac tamponade absent and Decreased contractility  COMMENT:  IVC limited reliability due to positive pressure ventilation.  Hx of COPD and unable to obtain PLAX and short axis view. In this setting only moderately reduced EF.          Tery Sanfilippo, MD 03/30/2016 [id number]    Shon Baton, MD 03/28/2016 [phone]

## 2016-03-31 NOTE — Progress Notes (Signed)
Removal of Right arterial sheath by Nigel Mormon. Manual compression applied for 15 minutes. Sterile dressing applied.  Upon departure bilaterla dressing dry and intact. HR: 113, SPO2 93%, Bp: 96/64. Groin stable with no oozing, brusing or hematoma present.

## 2016-03-31 NOTE — Progress Notes (Signed)
RT alerted RN of Panic values on morning ABG

## 2016-03-31 NOTE — Progress Notes (Signed)
Dr. Marisue Humble with Renal paged to ask about initiating CRRT after cardiac cath result. D/t pt's current status, and lab values, verbal orders given to hold off CRRT until AM team can assess. Will continue to monitor and assess pt closely.

## 2016-03-31 NOTE — Progress Notes (Signed)
  Echocardiogram 2D Echocardiogram has been performed.  Cathie Beams 03/31/2016, 12:46 PM

## 2016-03-31 NOTE — H&P (Addendum)
PULMONARY / CRITICAL CARE MEDICINE   Name: Thomas Mendoza MRN: [id number] DOB: [birth year]    ADMISSION DATE:  03/23/2016 CONSULTATION DATE:  03/24/2016  REFERRING MD:  Horton - EDP  CHIEF COMPLAINT:  SOB  HISTORY OF PRESENT ILLNESS:  Pt is encephelopathic; therefore, this HPI is obtained from chart review. Thomas Mendoza is a 75 y.o. male with PMH as outlined below including COPD (no PFT's in system).  He was in his usual state of health up until evening of 7/26 just before he and his wife were getting ready to go to bed.  Wife states as she was getting changed, he called out for her and complained of SOB.  Symptoms worsened to the point that she had to call EMS.  He had very mild SOB but per wife, symptoms suddenly worsened as he called out for her.  Pt tried to use his inhaler while EMS were on the way but got no relief.  On EMS arrival, he was place don CPAP and was given IM epi along with solumedrol and BD's.  On ED arrival, he remained hypoxic but was also tachycardic and hypotensive.  Due to respiratory distress, he required intubation in ED.  In addition, he was hypothermic, had elevated lactate (8.37), elevated troponin (2.54) and new LBBB.  Due to AKI, he was unable to get CTA of the chest.  Given his sudden onset of symptoms, he was started on empiric heparin for possible PE.  Due to troponin bump along with new LBBB, cardiology was also called by EDP.  PAST MEDICAL HISTORY :  He  has a past medical history of Depression; Emphysema of lung ([place]); Hyperlipidemia; and Hypertension.  PAST SURGICAL HISTORY: He  has a past surgical history that includes Appendectomy and Eye surgery.  No Known Allergies  No current facility-administered medications on file prior to encounter.    Current Outpatient Prescriptions on File Prior to Encounter  Medication Sig  . aspirin 81 MG tablet Take 81 mg by mouth at bedtime.    . citalopram (CELEXA) 40 MG tablet TAKE 1 TABLET (40 MG TOTAL) BY  MOUTH DAILY.  . clonazePAM (KLONOPIN) 1 MG tablet TAKE 1 TABLET BY MOUTH AT BEDTIME AS NEEDED FOR ANXIETY  . lisinopril (PRINIVIL,ZESTRIL) 10 MG tablet Take 1 tablet (10 mg total) by mouth daily.  . pravastatin (PRAVACHOL) 20 MG tablet Take 1 tablet (20 mg total) by mouth daily.  . predniSONE (DELTASONE) 20 MG tablet Two daily with food (Patient not taking: Reported on 05/16/2015)  . tiotropium (SPIRIVA HANDIHALER) 18 MCG inhalation capsule Place 1 capsule (18 mcg total) into inhaler and inhale 2 (two) times daily.  Marland Kitchen zoster vaccine live, PF, (ZOSTAVAX) 87564 UNT/0.65ML injection Inject 19,400 Units into the skin once.    FAMILY HISTORY:  His indicated that his mother is deceased. He indicated that his father is deceased. He indicated that all of his three sisters are alive. He indicated that his brother is alive. He indicated that his daughter is alive. He indicated that his son is alive.    SOCIAL HISTORY: He  reports that he has been smoking Cigarettes.  He has been smoking about 1.00 pack per day. He uses smokeless tobacco. He reports that he does not drink alcohol or use drugs.  REVIEW OF SYSTEMS:  Unable to obtain as pt is encephalopathic.  SUBJECTIVE: On vent, unresponsive.  VITAL SIGNS: BP 91/68   Pulse 119   Temp (!) 93.1 F (33.9 C) (Rectal)  Resp 24   Ht 5' 9"  (1.753 m)   Wt 143 lb (64.9 kg)   SpO2 95%   BMI 21.12 kg/m   HEMODYNAMICS:    VENTILATOR SETTINGS:    INTAKE / OUTPUT: No intake/output data recorded.   PHYSICAL EXAMINATION: General: Chronically ill appearing male, in NAD. Neuro: Sedated, non-responsive. HEENT: /AT. PERRL, sclerae anicteric. Cardiovascular: RRR, no M/R/G.  Lungs: Respirations even and unlabored.  Bilateral wheezes with bibasilar crackles. Abdomen: BS x 4, soft, NT/ND.  Musculoskeletal: No gross deformities, no edema.  Skin: Intact, warm, no rashes.  LABS:  BMET  Recent Labs Lab 03/08/2016 2341  NA 132*  K 4.6  CL 101   CO2 16*  BUN 23*  CREATININE 1.72*  GLUCOSE 337*    Electrolytes  Recent Labs Lab 03/11/2016 2341  CALCIUM 9.4  MG 2.6*  PHOS 7.0*    CBC  Recent Labs Lab 03/06/2016 2341  WBC 13.2*  HGB 12.5*  HCT 37.8*  PLT 327    Coag's No results for input(s): APTT, INR in the last 168 hours.  Sepsis Markers  Recent Labs Lab 03/15/2016 0003  LATICACIDVEN 8.37*    ABG  Recent Labs Lab 03/08/2016 2358  PHART 7.101*  PCO2ART 40.3  PO2ART 82.0    Liver Enzymes  Recent Labs Lab 04/02/2016 2341  AST 46*  ALT 21  ALKPHOS 81  BILITOT 0.7  ALBUMIN 3.4*    Cardiac Enzymes  Recent Labs Lab 03/05/2016 2341  TROPONINI 2.54*    Glucose No results for input(s): GLUCAP in the last 168 hours.  Imaging Dg Chest Portable 1 View  Result Date: 03/07/2016 CLINICAL DATA:  Intubation EXAM: PORTABLE CHEST 1 VIEW COMPARISON:  03/20/2016; 01/11/2015; 05/29/2014 FINDINGS: Examination is degraded secondary to exclusion of the bilateral costophrenic angles. The endotracheal tube overlies the tracheal air column with tip approximately 2.4 cm above the carina. Enteric tube tip and side port project below the midline of the lower thorax. No definite pneumothorax. Grossly unchanged cardiac silhouette and mediastinal contours. Atherosclerotic plaque within thoracic aorta. The pulmonary vasculature is indistinct with cephalization of flow. Worsening perihilar heterogeneous airspace opacities. No definite pleural effusion, though note, the bilateral costophrenic angles are excluded from view. Unchanged bones. IMPRESSION: 1. Endotracheal tube tip overlies the tracheal air column, approximately 2.4 cm above the carina. No pneumothorax. 2. Enteric tube projects below the midline of the lower thorax, tip excluded from view. 3. Similar findings of suspected pulmonary edema with worsening perihilar opacities with differential considerations include atelectasis, worsening alveolar pulmonary edema and  aspiration. 4.  Aortic Atherosclerosis (ICD10-170.0) Electronically Signed   By: Sandi Mariscal M.D.   On: 04/04/2016 00:40  Dg Chest Portable 1 View  Result Date: 03/23/2016 CLINICAL DATA:  75 year old male with shortness of breath EXAM: PORTABLE CHEST 1 VIEW COMPARISON:  Chest radiograph dated 01/11/2015 FINDINGS: Single portable view of the chest demonstrates emphysematous changes of the lungs with interstitial coarsening, progressed compared to prior study. Areas of hazy density in the perihilar region with Kerley B-lines suggest mild congestion and edema. Superimposed pneumonia is not excluded. Clinical correlation is recommended. There is no focal consolidation. No pneumothorax. The cardiac silhouette is within normal limits with no acute osseous pathology. IMPRESSION: Mild pulmonary vascular congestion and interstitial edema on the background of emphysema. Pneumonia is not excluded. Clinical correlation is recommended. Electronically Signed   By: Anner Crete M.D.   On: 03/10/2016 00:11    STUDIES:  CXR 7/27 > mild pulm vascular congestion with  possible PNA. Echo 7/27 > LE duplex 7/27 >  CULTURES: Blood 7/27 > Urine 7/27 > Sputum 7/27 >  ANTIBIOTICS: Azithro 7/27 > Ceftriaxone 7/27 >   SIGNIFICANT EVENTS: 7/27 > admitted.  LINES/TUBES: ETT 7/27 > L [place] CVL 7/27 >  DISCUSSION: 75 y.o. M with hx COPD by report, admitted 7/27 with acute respiratory distress requiring intubation in ED.  Concern for PE given sudden onset of symptoms and clinical picture but unable to CTA chest due to AKI.  In addition, has troponin bump along with new LBBB.  Cardiology has been called by EDP.  ASSESSMENT / PLAN:  PULMONARY A: Acute hypoxic respiratory failure. Concern for PE - certainly high on the differential given his hx and clinical picture. Mild pulmonary edema. Possible CAP. Tobacco dependence. P:   Full vent support. Wean as able. VAP prevention measures. SBT in AM if  able. Empiric heparin gtt. Unable to assess CTA due to AKI. Assess echo, LE duplex. Diuresis restricted due to shock. Abx / cultures per ID section. CXR in AM. Tobacco cessation counseling once extubated.  CARDIOVASCULAR A:  Shock - unclear etiology.  Concern for cardiogenic from ACS vs obstructive from PE. ACS / NSTEMI - EKG with new LBBB. Hx HTN, HLD. P:  Continue levophed as needed for goal MAP > 65. Continue heparin gtt. Trend troponins, lactate. Assess echo. Cardiology consulted by EDP, appreciate the assistance. Continue outpatient ASA, pravastatin. Hold outpatient lisinopril.  RENAL A:   Hyponatremia. AKI. AGMA - lactate. P:   NS @ 75. Correct electrolytes as indicated. BMP in AM.  GASTROINTESTINAL A:   GI prophylaxis. Nutrition. P:   SUP: Pantoprazole. NPO.  HEMATOLOGIC A:   VTE Prophylaxis. P:  SCD's / heparin gtt. CBC in AM.  INFECTIOUS A:   Possible CAP. P:   Abx as above (azithromycin / ceftriaxone).  Follow cultures as above. Assess PCT.  ENDOCRINE A:   Hyperglycemia - no hx DM.  Exacerbated by steroids. P:   SSI. Assess Hgb A1c.  NEUROLOGIC A:   Acute encephalopathy. P:   Sedation:  Fentanyl gtt / Midazolam PRN. RASS goal: 0 to -1. Daily WUA. Hold outpatient citalopram.  Family updated: Wife updated at bedside.  Interdisciplinary Family Meeting v Palliative Care Meeting:  Due by: 04/07/16.  CC time: 40 minutes.   Montey Hora, [facility] Pager: [phone]  or [phone] 03/11/2016, 1:55 AM  Attending note: I have seen and examined the patient with nurse practitioner/resident and agree with the note. History, labs and imaging reviewed.  75 Y/O with H/O depression, COPD admitted with acute resp failure. Found to have AKI, elevated LA, Mixed AG met and resp acidosis and elevated troponins with new LBBB  DDx include MI with cardiogenic shock, PE, sepsis from UTI vs  Pneumonia.  Labs and imaging reviewed. ABG shows acidosis but we dont have much room to adjust vent as he is autopeeing. High peak pressures are suggestive of AECOPD  - Continue levaphed for BP support. Check CVP. - Start bicarb drip - Abx coverage with ceftriaxone and azithro - Continue Prednisone - Follow cultures and Pct - Start insulin drip as BS is high and + ketones in urine.  - On ASA and empiric heparin - Awaiting cardiology consult, echo and LE dopplers  Rest of plan as above.  Critical care time- 35 mins. This represents my independent time taking care of the patient.  Marshell Garfinkel MD  [facility] Pager [phone] If no answer or after 3pm call: [phone] 03/14/2016, 4:25 AM

## 2016-03-31 NOTE — Progress Notes (Signed)
Removal of Left Femoral Arterial sheath without complication. Manual compression applied for 15 minutes by Kerrie Pleasure. Sterile dressing dressing applied. Hr: 118, BP: 98/64, SPO2: 94%.

## 2016-03-31 NOTE — Progress Notes (Signed)
Patient ID: FERNIE LIPPMANN, male   DOB: 05/09/1941, 75 y.o.   MRN: [id number]  Continued ccm time and manamagent throughout day  Refractory shock MODS Cardiogenic shock ACS Mixed metabolic and resp acidosis ARF  No changes in physical examination  Plan: STAT HD cath, start cvvhd Increase MV, repeat abg Bicarb for k for now, likley to dc later STAT a line levophed to map goal Echo just back EF 10%,d/w cards Consider dc abx CHF service consult, consider cath and device to increase CO consider swan I called wfie earelier.  I have had extensive discussions with family wife *. We discussed patients current circumstances and organ failures. We also discussed patient's prior wishes under circumstances such as this. Family has decided to NOT perform resuscitation if arrest but to continue current medical support for now.  Aggressive care otherwise His fxnal status was described as good   Ccm time in total now 2 hours from me  Mcarthur Rossetti. [name], MD, FACP Pgr: [phone] [facility]

## 2016-03-31 NOTE — Care Management Note (Signed)
Case Management Note  Patient Details  Name: Thomas Mendoza MRN: [id number] Date of Birth: Mar 28, 1941  Subjective/Objective:     Pt admitted with resp failure - intubated               Action/Plan:  PTA from home with wife.  CM will continue to monitor for discharge needs   Expected Discharge Date:                  Expected Discharge Plan:  Home/Self Care  In-House Referral:     Discharge planning Services  CM Consult  Post Acute Care Choice:    Choice offered to:     DME Arranged:    DME Agency:     HH Arranged:    HH Agency:     Status of Service:  In process, will continue to follow  If discussed at Long Length of Stay Meetings, dates discussed:    Additional Comments:  Cherylann Parr, RN 04-06-16, 9:54 AM

## 2016-03-31 NOTE — Progress Notes (Signed)
Pharmacy Antibiotic Note  Thomas Mendoza is a 75 y.o. male admitted on 03/08/2016 with CAP. Required intubation in ED. Pharmacy has been consulted for Rocephin and Azithromycin dosing. Pt received Zosyn 3.375gm IV in ED ~midnight  Plan: Rocephin 1gm IV q24h Azithromycin 500mg  IV q24h Will f/u micro data, renal function, and pt's clinical condition   Height: 5\' 9"  (175.3 cm) Weight: 143 lb (64.9 kg) IBW/kg (Calculated) : 70.7  Temp (24hrs), Avg:93.3 F (34.1 C), Min:93.1 F (33.9 C), Max:93.4 F (34.1 C)   Recent Labs Lab 03/30/2016 0003  LATICACIDVEN 8.37*    CrCl cannot be calculated (Patient's most recent lab result is older than the maximum 21 days allowed.).    No Known Allergies  Antimicrobials this admission: 7/27 Zosyn x 1 7/27 Rocephin >> 7/27 Azith >>   Dose adjustments this admission: n/a  Microbiology results: 7/26 BCx x2:  UCx:    Thank you for allowing pharmacy to be a part of this patient's care.  Christoper Fabian, PharmD, BCPS Clinical pharmacist, pager [phone] 03/11/2016 12:56 AM

## 2016-03-31 NOTE — Progress Notes (Signed)
RT NOTE:  Critical ABG reported to Dr. Horton.  

## 2016-03-31 NOTE — Progress Notes (Addendum)
RT NOTE:  Critical ABG reported to Dr. Horton.  

## 2016-03-31 NOTE — H&P (Signed)
PULMONARY / CRITICAL CARE MEDICINE   Name: Thomas Mendoza MRN: [id number] DOB: 1941/04/25    ADMISSION DATE:  03/05/2016 CONSULTATION DATE:  03/11/2016  REFERRING MD:  Horton - EDP  CHIEF COMPLAINT:  SOB  HISTORY OF PRESENT ILLNESS:  Pt is encephelopathic; therefore, this HPI is obtained from chart review. Thomas Mendoza is a 75 y.o. male with PMH as outlined below including COPD (no PFT's in system).  He was in his usual state of health up until evening of 7/26 just before he and his wife were getting ready to go to bed.  Wife states as she was getting changed, he called out for her and complained of SOB.  Symptoms worsened to the point that she had to call EMS.  He had very mild SOB but per wife, symptoms suddenly worsened as he called out for her.  Pt tried to use his inhaler while EMS were on the way but got no relief.  On EMS arrival, he was place don CPAP and was given IM epi along with solumedrol and BD's.  On ED arrival, he remained hypoxic but was also tachycardic and hypotensive.  Due to respiratory distress, he required intubation in ED.  In addition, he was hypothermic, had elevated lactate (8.37), elevated troponin (2.54) and new LBBB.  Due to AKI, he was unable to get CTA of the chest.  Given his sudden onset of symptoms, he was started on empiric heparin for possible PE.  Due to troponin bump along with new LBBB, cardiology was also called by EDP.   SUBJECTIVE: horrible acidosis worsening, levophed up  VITAL SIGNS: BP (!) 88/68   Pulse (!) 140   Temp 97.9 F (36.6 C)   Resp (!) 30   Ht  (1.753 m)   Wt 63.3 kg (139 lb 8.8 oz)   SpO2 92%   BMI 20.61 kg/m   HEMODYNAMICS: CVP:  [3 mmHg-13 mmHg] 13 mmHg  VENTILATOR SETTINGS: Vent Mode: PRVC FiO2 (%):  [60 %-100 %] 60 % Set Rate:  [24 bmp-32 bmp] 30 bmp Vt Set:  [550 mL-630 mL] 630 mL PEEP:  [5 cmH20] 5 cmH20 Plateau Pressure:  [13 cmH20-22 cmH20] 18 cmH20  INTAKE / OUTPUT: I/O last 3 completed shifts: In:  4816.9 [I.V.:4686.9; NG/GT:30; IV Piggyback:100] Out: -    PHYSICAL EXAMINATION: General: Chronically ill appearing male, vent Neuro: Sedated, non-responsive., rass -3 HEENT: [place]/AT. PERRL, sclerae anicteric Cardiovascular: s1 s2 RRT, no M/R/G  Lungs: wheezing mod diffuse, some low air entry Abdomen: BS x 4, soft, No r/g Musculoskeletal: No gross deformities, no edema.  Skin: Intact, warm, no rashes. Gen; hernia rt not warm or red  LABS:  BMET  Recent Labs Lab 03/12/2016 2341 03/16/2016 0520  NA 132* 132*  K 4.6 5.7*  CL 101 105  CO2 16* 13*  BUN 23* 25*  CREATININE 1.72* 1.96*  GLUCOSE 337* 346*    Electrolytes  Recent Labs Lab 03/22/2016 2341 03/12/2016 0520  CALCIUM 9.4 7.9*  MG 2.6*  --   PHOS 7.0*  --     CBC  Recent Labs Lab 04/02/2016 2341 03/08/2016 0520  WBC 13.2* 15.0*  HGB 12.5* 11.6*  HCT 37.8* 33.7*  PLT 327 551*    Coag's No results for input(s): APTT, INR in the last 168 hours.  Sepsis Markers  Recent Labs Lab 04/04/2016 0216 03/24/2016 0226 03/11/2016 0520 03/16/2016 0800  LATICACIDVEN 4.9* 4.89* 6.5*  --   PROCALCITON 0.10  --   --  2.96    ABG  Recent Labs Lab 04/09/16 0349 04/09/16 0613 04-09-2016 0846  PHART 7.073* 7.042* 7.032*  PCO2ART 57.7* 53.4* 52.3*  PO2ART 84.9 87.8 75.0*    Liver Enzymes  Recent Labs Lab 03/07/2016 2341  AST 46*  ALT 21  ALKPHOS 81  BILITOT 0.7  ALBUMIN 3.4*    Cardiac Enzymes  Recent Labs Lab 03/14/2016 2341 2016-04-09 0520  TROPONINI 2.54* 10.57*    Glucose  Recent Labs Lab 04-09-16 0307 04/09/2016 0749  GLUCAP 417* 363*    Imaging Dg Chest Portable 1 View  Result Date: 04-09-16 CLINICAL DATA:  75 year old male with central line placement. EXAM: PORTABLE CHEST 1 VIEW COMPARISON:  Chest radiograph dated 04-09-2016 FINDINGS: There has been interval placement of a left subclavian central line with tip over central SVC. The endotracheal and enteric tubes are in stable positioning.  Emphysematous changes of the lungs with bilateral patchy ground-glass densities similar to prior study. The cardiac silhouette is within normal limits. No acute osseous pathology. IMPRESSION: Interval placement of a left subclavian central venous catheter with tip over central SVC. No pneumothorax. Other support devices in stable positioning. Emphysema with bilateral patchy airspace densities as seen on the prior study. Follow-up recommended. Electronically Signed   By: Elgie Collard M.D.   On: 04/09/16 02:00  Dg Chest Portable 1 View  Result Date: April 09, 2016 CLINICAL DATA:  Intubation EXAM: PORTABLE CHEST 1 VIEW COMPARISON:  04/04/2016; 01/11/2015; 05/29/2014 FINDINGS: Examination is degraded secondary to exclusion of the bilateral costophrenic angles. The endotracheal tube overlies the tracheal air column with tip approximately 2.4 cm above the carina. Enteric tube tip and side port project below the midline of the lower thorax. No definite pneumothorax. Grossly unchanged cardiac silhouette and mediastinal contours. Atherosclerotic plaque within thoracic aorta. The pulmonary vasculature is indistinct with cephalization of flow. Worsening perihilar heterogeneous airspace opacities. No definite pleural effusion, though note, the bilateral costophrenic angles are excluded from view. Unchanged bones. IMPRESSION: 1. Endotracheal tube tip overlies the tracheal air column, approximately 2.4 cm above the carina. No pneumothorax. 2. Enteric tube projects below the midline of the lower thorax, tip excluded from view. 3. Similar findings of suspected pulmonary edema with worsening perihilar opacities with differential considerations include atelectasis, worsening alveolar pulmonary edema and aspiration. 4.  Aortic Atherosclerosis (ICD10-170.0) Electronically Signed   By: Simonne Come M.D.   On: 04/09/16 00:40  Dg Chest Portable 1 View  Result Date: 04-09-16 CLINICAL DATA:  75 year old male with shortness of  breath EXAM: PORTABLE CHEST 1 VIEW COMPARISON:  Chest radiograph dated 01/11/2015 FINDINGS: Single portable view of the chest demonstrates emphysematous changes of the lungs with interstitial coarsening, progressed compared to prior study. Areas of hazy density in the perihilar region with Kerley B-lines suggest mild congestion and edema. Superimposed pneumonia is not excluded. Clinical correlation is recommended. There is no focal consolidation. No pneumothorax. The cardiac silhouette is within normal limits with no acute osseous pathology. IMPRESSION: Mild pulmonary vascular congestion and interstitial edema on the background of emphysema. Pneumonia is not excluded. Clinical correlation is recommended. Electronically Signed   By: Elgie Collard M.D.   On: 04-09-2016 00:11    STUDIES:  CXR 7/27 > mild pulm vascular congestion with possible PNA. Echo 7/27 > LE duplex 7/27 >  CULTURES: Blood 7/27 > Urine 7/27 > Sputum 7/27 >  ANTIBIOTICS: Azithro 7/27 >>> Ceftriaxone 7/27 >>> ceftaz 7/27>>> vanc 7/27>>>  SIGNIFICANT EVENTS: 7/27 > admitted, acidosis, shock, LBBB, trop up, LA up  LINES/TUBES: ETT 7/27 >>> L [place] CVL 7/27 >>>  DISCUSSION: 75 y.o. M with hx COPD by report, admitted 7/27 with acute respiratory distress requiring intubation in ED.  Concern for PE given sudden onset of symptoms and clinical picture but unable to CTA chest due to AKI.  In addition, has troponin bump along with new LBBB.  Cardiology has been called by EDP.  ASSESSMENT / PLAN:  PULMONARY A: Acute hypoxic respiratory failure. Unlikely PE with pcxr findings and LBBB pulmonary edema - r/o ischemia Possible CAP=, low suspcion Tobacco dependence, COPD P:   ABg reviewed, increase T V for now, rate is maxed to 30, repeat abg in 30 min  Place a line likely can reduce fio2 to goal 50% abg in am  Even balance goals cvp assessment 13 noted solumedrol increase Bders Doppler, echo- see  cvs  CARDIOVASCULAR A:  Shock - unclear etiology.  Concern for cardiogenic from ACS vs obstructive from PE (favor less) ACS / NSTEMI - EKG with new LBBB. Hx HTN, HLD P:  Continue levophed as needed for goal MAP > 65. Continue heparin gtt troponins, lactat Assess echo STAT Cardiology consulted, no note, call back now Continue outpatient ASA, pravastatin. Hold outpatient lisinopril. Might need vasopressin Place aline Continued steroids  RENAL A:   Hyponatremia, hyperkalemia AKI AGMA - lactate, larger NONAG (type b lactic) Inaccurate bladder scan? P:   NS avoid Correct electrolytes as indicated. BMP in AM. Increase biacarb bmet q4h replace foley but Bladder shows 250 - Seriel Bladder scans in 4 hours  kayxlate  X 1 May need renal consult  GASTROINTESTINAL A:   GI prophylaxis. Nutrition. P:   SUP: Pantoprazole. NPO Repeat lft in am  Hold feeds  HEMATOLOGIC A:   VTE Prophylaxis. P:  SCD's / heparin gtt. CBC in AM.  INFECTIOUS A:   Possible CAP ( do not favor), copd at risk pseudo P:   Abx as above (azithromycin / ceftriaxone), change to vanc ceftaz, keep azithro Follow cultures as above. Assess PCT trend Assess leg, strep ag  ENDOCRINE A:   Hyperglycemia - no hx DM.  Exacerbated by steroids. P:   SSI. steroids  NEUROLOGIC A:   Acute encephalopathy. P:   Sedation:  Fentanyl gtt, increase change rass -3 Dc Midazolam PRN. RASS goal: 0 to -1. Daily WUA. Hold outpatient citalopram.  Family updated: Wife updated at bedside.  Interdisciplinary Family Meeting v Palliative Care Meeting:  Due by: 04/07/16.  Ccm time 60 min   Mcarthur Rossetti. [name], MD, FACP Pgr: [phone] [facility]

## 2016-03-31 NOTE — Consult Note (Signed)
Note: Patient is Intubated. No family by beside. History was obtained by chart review.   Patient ID: Thomas Mendoza MRN: [id number], DOB/AGE: Jan 14, 1941   Admit date: 03/27/2016   Reason for Consult: Elevated Troponin; ? Cardiogenic Shock Requesting MD: Dr. Titus Mould, Critical Care    Primary Physician: Robyn Haber, MD Primary Cardiologist: New   Pt. Profile:  75 y/o male with h/o COPD, HTN, and HLD admitted for acute respiratory failure requiring intubation. Elevated lactate (8.37), elevated troponin (2.54>>10.57>> >65) and new LBBB.   Problem List  Past Medical History:  Diagnosis Date  . Depression   . Emphysema of lung ([place])   . Hyperlipidemia   . Hypertension     Past Surgical History:  Procedure Laterality Date  . APPENDECTOMY    . EYE SURGERY       Allergies  No Known Allergies  HPI  75 y/o male with h/o COPD, HTN, and HLD admitted for acute respiratory failure requiring intubation. He was found to have an elevated lactate level at 8.37. Initial troponin was abnormal at 2.54. This has risen significantly to 10.57 to >65. He also has a new LBBB on EKG. Patient remains intubated and no family is currently present at beside, thus history is limited. Unsure if any recent CP. What can be gathered from H&P is that the patient complained of acute onset of dyspnea prior to arrival to hospital by EMS. No reports of CP outlined in H&P. 2D echo is pending. He is sinus tach on telemetry. CXRs with findings of suspected pulmonary edema with worsening perihilar opacities with differential considerations include atelectasis, worsening alveolar pulmonary edema and aspiration.  He also has acute renal failure with rising SCr now at 2.42. He is on antibiotics, IV heparin, levophed and vasopressin.   Home Medications  Prior to Admission medications   Medication Sig Start Date End Date Taking? Authorizing Provider  aspirin 81 MG tablet Take 81 mg by mouth at bedtime.       Historical Provider, MD  citalopram (CELEXA) 40 MG tablet TAKE 1 TABLET (40 MG TOTAL) BY MOUTH DAILY. 05/16/15   Robyn Haber, MD  clonazePAM (KLONOPIN) 1 MG tablet TAKE 1 TABLET BY MOUTH AT BEDTIME AS NEEDED FOR ANXIETY 09/30/15   Robyn Haber, MD  lisinopril (PRINIVIL,ZESTRIL) 10 MG tablet Take 1 tablet (10 mg total) by mouth daily. 05/16/15   Robyn Haber, MD  pravastatin (PRAVACHOL) 20 MG tablet Take 1 tablet (20 mg total) by mouth daily. 05/16/15   Robyn Haber, MD  predniSONE (DELTASONE) 20 MG tablet Two daily with food Patient not taking: Reported on 05/16/2015 01/11/15   Robyn Haber, MD  tiotropium (SPIRIVA HANDIHALER) 18 MCG inhalation capsule Place 1 capsule (18 mcg total) into inhaler and inhale 2 (two) times daily. 09/30/15   Robyn Haber, MD  zoster vaccine live, PF, (ZOSTAVAX) 22633 UNT/0.65ML injection Inject 19,400 Units into the skin once. 05/29/14   Robyn Haber, MD    Hospital Meds . antiseptic oral rinse  7 mL Mouth Rinse QID  . aspirin  81 mg Per Tube Daily  . azithromycin  500 mg Intravenous QHS  . cefTAZidime (FORTAZ)  IV  2 g Intravenous Q12H  . chlorhexidine gluconate (SAGE KIT)  15 mL Mouth Rinse BID  . [START ON 2016-04-15] famotidine (PEPCID) IV  20 mg Intravenous Q24H  . insulin regular  0-10 Units Intravenous TID WC  . ipratropium-albuterol  3 mL Nebulization Q6H  . methylPREDNISolone (SOLU-MEDROL) injection  80 mg Intravenous Q8H  .  pravastatin  20 mg Per Tube q1800  . sodium chloride flush  3 mL Intravenous Q12H  . [START ON 04-22-16] vancomycin  500 mg Intravenous Q24H   . dextrose 5 % and 0.45% NaCl    . fentaNYL infusion INTRAVENOUS 50 mcg/hr (03/10/2016 0951)  . heparin 10,000 units/ 20 mL infusion syringe    . heparin 900 Units/hr (03/21/2016 1201)  . insulin (NOVOLIN-R) infusion 7.8 Units/hr (03/07/2016 1207)  . norepinephrine (LEVOPHED) Adult infusion 28 mcg/min (03/08/2016 1209)  . dialysis replacement fluid (prismasate)    . dialysis  replacement fluid (prismasate)    . dialysate (PRISMASATE)    .  sodium bicarbonate  infusion 1000 mL 125 mL/hr at 03/10/2016 0754  . vasopressin (PITRESSIN) infusion - *FOR SHOCK* 0.03 Units/min (03/20/2016 1113)    Family History  Family History  Problem Relation Age of Onset  . COPD Father   . Cancer Father   . Alcohol abuse Father     Social History  Social History   Social History  . Marital status: Married    Spouse name: N/A  . Number of children: N/A  . Years of education: N/A   Occupational History  . Not on file.   Social History Main Topics  . Smoking status: Current Every Day Smoker    Packs/day: 1.00    Types: Cigarettes  . Smokeless tobacco: Current User  . Alcohol use No  . Drug use: No  . Sexual activity: Yes     Comment: number of sex partners in the last 12 months  1   Other Topics Concern  . Not on file   Social History Narrative   Exercise walking     Review of Systems General:  No chills, fever, night sweats or weight changes.  Cardiovascular:  No chest pain, dyspnea on exertion, edema, orthopnea, palpitations, paroxysmal nocturnal dyspnea. Dermatological: No rash, lesions/masses Respiratory: No cough, dyspnea Urologic: No hematuria, dysuria Abdominal:   No nausea, vomiting, diarrhea, bright red blood per rectum, melena, or hematemesis Neurologic:  No visual changes, wkns, changes in mental status. All other systems reviewed and are otherwise negative except as noted above.  Physical Exam  Blood pressure 91/66, pulse (!) 131, temperature 98.8 F (37.1 C), resp. rate (!) 30, height 5' 9"  (1.753 m), weight 139 lb 8.8 oz (63.3 kg), SpO2 92 %.  General: Intubated, bear hugger Psych: Intubated Neuro: Alert and oriented X 3. Moves all extremities spontaneously. HEENT: Normal  Neck: Supple without bruits or JVD. Lungs:  Intubated, faint rhonchi anteriorly  Heart: reg rhythm, tachy rate no s3, s4, or murmurs. Abdomen: Soft, non-tender,  non-distended, BS + x 4.  Extremities: No clubbing, cyanosis or edema. DP/PT/Radials 2+ and equal bilaterally.  Labs  Troponin [facility] of Care Test)  Recent Labs  03/21/2016 0001  TROPIPOC 1.30*    Recent Labs  03/28/2016 2341 03/07/2016 0520  TROPONINI 2.54* 10.57*   Lab Results  Component Value Date   WBC 15.0 (H) 03/17/2016   HGB 11.6 (L) 03/22/2016   HCT 33.7 (L) 03/10/2016   MCV 108.4 (H) 03/26/2016   PLT 551 (H) 03/13/2016    Recent Labs Lab 03/14/2016 2341  03/23/2016 0935  NA 132*  < > 135  K 4.6  < > 4.7  CL 101  < > 105  CO2 16*  < > 16*  BUN 23*  < > 27*  CREATININE 1.72*  < > 2.42*  CALCIUM 9.4  < > 7.9*  PROT 6.8  --   --  BILITOT 0.7  --   --   ALKPHOS 81  --   --   ALT 21  --   --   AST 46*  --   --   GLUCOSE 337*  < > 304*  < > = values in this interval not displayed. Lab Results  Component Value Date   CHOL 140 05/16/2015   HDL 34 (L) 05/16/2015   LDLCALC 79 05/16/2015   TRIG 137 05/16/2015   No results found for: DDIMER   Radiology/Studies  Dg Chest Portable 1 View  Result Date: 04/04/2016 CLINICAL DATA:  75 year old male with central line placement. EXAM: PORTABLE CHEST 1 VIEW COMPARISON:  Chest radiograph dated 03/29/2016 FINDINGS: There has been interval placement of a left subclavian central line with tip over central SVC. The endotracheal and enteric tubes are in stable positioning. Emphysematous changes of the lungs with bilateral patchy ground-glass densities similar to prior study. The cardiac silhouette is within normal limits. No acute osseous pathology. IMPRESSION: Interval placement of a left subclavian central venous catheter with tip over central SVC. No pneumothorax. Other support devices in stable positioning. Emphysema with bilateral patchy airspace densities as seen on the prior study. Follow-up recommended. Electronically Signed   By: Anner Crete M.D.   On: 03/27/2016 02:00  Dg Chest Portable 1 View  Result Date:  03/13/2016 CLINICAL DATA:  Intubation EXAM: PORTABLE CHEST 1 VIEW COMPARISON:  03/20/2016; 01/11/2015; 05/29/2014 FINDINGS: Examination is degraded secondary to exclusion of the bilateral costophrenic angles. The endotracheal tube overlies the tracheal air column with tip approximately 2.4 cm above the carina. Enteric tube tip and side port project below the midline of the lower thorax. No definite pneumothorax. Grossly unchanged cardiac silhouette and mediastinal contours. Atherosclerotic plaque within thoracic aorta. The pulmonary vasculature is indistinct with cephalization of flow. Worsening perihilar heterogeneous airspace opacities. No definite pleural effusion, though note, the bilateral costophrenic angles are excluded from view. Unchanged bones. IMPRESSION: 1. Endotracheal tube tip overlies the tracheal air column, approximately 2.4 cm above the carina. No pneumothorax. 2. Enteric tube projects below the midline of the lower thorax, tip excluded from view. 3. Similar findings of suspected pulmonary edema with worsening perihilar opacities with differential considerations include atelectasis, worsening alveolar pulmonary edema and aspiration. 4.  Aortic Atherosclerosis (ICD10-170.0) Electronically Signed   By: Sandi Mariscal M.D.   On: 03/16/2016 00:40  Dg Chest Portable 1 View  Result Date: 03/22/2016 CLINICAL DATA:  75 year old male with shortness of breath EXAM: PORTABLE CHEST 1 VIEW COMPARISON:  Chest radiograph dated 01/11/2015 FINDINGS: Single portable view of the chest demonstrates emphysematous changes of the lungs with interstitial coarsening, progressed compared to prior study. Areas of hazy density in the perihilar region with Kerley B-lines suggest mild congestion and edema. Superimposed pneumonia is not excluded. Clinical correlation is recommended. There is no focal consolidation. No pneumothorax. The cardiac silhouette is within normal limits with no acute osseous pathology. IMPRESSION: Mild  pulmonary vascular congestion and interstitial edema on the background of emphysema. Pneumonia is not excluded. Clinical correlation is recommended. Electronically Signed   By: Anner Crete M.D.   On: 03/13/2016 00:11   ECG  Sinus tach w/ new LBBB  Echocardiogram- pending   ASSESSMENT AND PLAN  Active Problems:   Respiratory failure ([place])   1. Acute Respiratory Failure: cardiogenic shock is a likely etiology given troponin of >65 and new LBBB on EKG. History is limited by patient status, as he remains intubated. Unsure of any recent anginal pain.  2D echo pending to assess LVF, wall motion, the pericardium and valve anatomy. He has worsening renal failure 2/2 shock, now with SCr at 2.42. He will likely need LHC to r/o CAD. Will f/u on echo results. Continue IV heparin. MD to follow with further recommendations.    Signed, Lyda Jester, PA-C 03/26/2016, 12:57 PM  Patient seen with PA, agree with the above note.  Patient has history of COPD.  He developed dyspnea at home last night.  He came to the ER with respiratory arrest, hypoxemia and developed hypotension.  DDx initially AECOPD versus PNA vs PE vs MI.  Initial troponin 2, rose to >65 through the day today.  He has become progressively hypotensive.  He is intubated.  He is oliguric and CVVH is being prepared, creatinine up to 2.42.  1. Acute MI: LBBB on ECG, ?new, not on last ECG in 2015.  Troponin up to > 65.  Echo done, EF 10%.  Unsure if first event was respiratory-related (acute exacerbation of COPD/PNA) followed by MI or was all related to MI.  Currently suspect cardiogenic shock.  - Discussed with patient's wife and grandson, plan to take him to cath lab for angiography and placement of Impella.  She understands the considerable risk of worsening renal function and agrees for him to have procedure. If PCI is possible, will undertake.  - He has had ASA. 2. Shock: Suspect cardiogenic shock more likely than septic shock based  on EF 10% on echo.  He is currently on vasopressin and norepinephrine with SBP in upper 80s currently.  - I think that he will need mechanical circulatory support, will place Impella in cath lab prior to angiography/PCI.  3. AKI: Oliguric in setting of shock.  CVVH planned.  Family understands risk of worsening renal situation from coronary angigoraphy.  4. ID: WBCs elevated, coverning with broad spectrum antibiotics for PNA . 5. COPD: ?component of COPD exacerbation.   Loralie Champagne 03/12/2016 2:12 PM ]

## 2016-03-31 NOTE — Procedures (Signed)
Central Venous Catheter Insertion Procedure Note Thomas Mendoza [id number] 1940-10-09  Procedure: Insertion of Central Venous Catheter Indications: cvvhd  Procedure Details Consent: Risks of procedure as well as the alternatives and risks of each were explained to the (patient/caregiver).  Consent for procedure obtained. Time Out: Verified patient identification, verified procedure, site/side was marked, verified correct patient position, special equipment/implants available, medications/allergies/relevent history reviewed, required imaging and test results available.  Performed  Maximum sterile technique was used including antiseptics, cap, gloves, gown, hand hygiene, mask and sheet. Skin prep: Chlorhexidine; local anesthetic administered A antimicrobial bonded/coated triple lumen catheter was placed in the left femoral vein due to patient being a dialysis patient using the Seldinger technique.  Evaluation Blood flow good Complications: No apparent complications Patient did tolerate procedure well. Chest X-ray ordered to verify placement.  CXR: normal.  Thomas Mendoza. 04-05-2016, 1:54 PM  [place]  Thomas Mendoza. [name], MD, FACP Pgr: [phone] [facility]

## 2016-03-31 NOTE — Progress Notes (Signed)
Preliminary results by tech - Venous Duplex Lower Ext. Completed. Negative for deep and superficial vein thrombosis in both legs.  Garnie Borchardt, BS, RDMS, RVT  

## 2016-03-31 NOTE — Progress Notes (Signed)
CRITICAL VALUE ALERT  Critical value received:  Troponin > 65  Date of notification:  03/07/2016  Time of notification:  1258  Critical value read back:Yes.    Nurse who received alert:  Lorin Picket  MD notified (1st page):  [name]  Time of first page:  1300  MD notified (2nd page):  Time of second page:  Responding MD:  [name]  Time MD responded:  1300

## 2016-03-31 NOTE — ED Notes (Signed)
Family at bedside with EDP and CCM

## 2016-03-31 NOTE — Consult Note (Signed)
Reason for Consult: AKI Referring Physician:  [name] is an 75 y.o. male.  HPI: Patient is a 75 year old male with a past medical history of hypertension, hyperlipidemia, COPD currently admitted in ICU for cardiogenic shock. Patient became acutely short of breath at home and upon arrival to the hospital was found to be hypoxic, tachycardic, hypotensive, and hypothermic. His lactic acid was elevated (8.3). In addition, troponin was elevated (2.5) and patient had a (new?) left bundle branch block on EKG. Creatinine elevated at 1.7. He was intubated and started on empiric heparin for possible PE (no CTA done due to AKI). Chest x-ray showing mild pulmonary vascular congestion and interstitial edema on the background of emphysema. Echo done today showing left ventricular ejection fraction 10-15%.  As per chart, patient had normal renal function in September 2016 and is now presenting with an elevated creatinine (1.7 > 1.9 > 2.4). Wife at bedside denies patient having any prior history of kidney disease/ worsening renal function.  Past Medical History:  Diagnosis Date  . Depression   . Emphysema of lung ([place])   . Hyperlipidemia   . Hypertension     Past Surgical History:  Procedure Laterality Date  . APPENDECTOMY    . EYE SURGERY      Family History  Problem Relation Age of Onset  . COPD Father   . Cancer Father   . Alcohol abuse Father     Social History:  reports that he has been smoking Cigarettes.  He has been smoking about 1.00 pack per day. He uses smokeless tobacco. He reports that he does not drink alcohol or use drugs.  Allergies: No Known Allergies  Results for orders placed or performed during the hospital encounter of 03/09/2016 (from the past 48 hour(s))  CBC     Status: Abnormal   Collection Time: 03/12/2016 11:41 PM  Result Value Ref Range   WBC 13.2 (H) 4.0 - 10.5 K/uL   RBC 3.46 (L) 4.22 - 5.81 MIL/uL   Hemoglobin 12.5 (L) 13.0 - 17.0 g/dL   HCT 37.8 (L) 39.0 -  52.0 %   MCV 109.2 (H) 78.0 - 100.0 fL   MCH 36.1 (H) 26.0 - 34.0 pg   MCHC 33.1 30.0 - 36.0 g/dL   RDW 13.0 11.5 - 15.5 %   Platelets 327 150 - 400 K/uL  Comprehensive metabolic panel     Status: Abnormal   Collection Time: 03/24/2016 11:41 PM  Result Value Ref Range   Sodium 132 (L) 135 - 145 mmol/L   Potassium 4.6 3.5 - 5.1 mmol/L   Chloride 101 101 - 111 mmol/L   CO2 16 (L) 22 - 32 mmol/L   Glucose, Bld 337 (H) 65 - 99 mg/dL   BUN 23 (H) 6 - 20 mg/dL   Creatinine, Ser 1.72 (H) 0.61 - 1.24 mg/dL   Calcium 9.4 8.9 - 10.3 mg/dL   Total Protein 6.8 6.5 - 8.1 g/dL   Albumin 3.4 (L) 3.5 - 5.0 g/dL   AST 46 (H) 15 - 41 U/L   ALT 21 17 - 63 U/L   Alkaline Phosphatase 81 38 - 126 U/L   Total Bilirubin 0.7 0.3 - 1.2 mg/dL   GFR calc non Af Amer 37 (L) >60 mL/min   GFR calc Af Amer 43 (L) >60 mL/min    Comment: (NOTE) The eGFR has been calculated using the CKD EPI equation. This calculation has not been validated in all clinical situations. eGFR's persistently <60 mL/min signify  possible Chronic Kidney Disease.    Anion gap 15 5 - 15  Magnesium     Status: Abnormal   Collection Time: 03/14/2016 11:41 PM  Result Value Ref Range   Magnesium 2.6 (H) 1.7 - 2.4 mg/dL  Phosphorus     Status: Abnormal   Collection Time: 03/17/2016 11:41 PM  Result Value Ref Range   Phosphorus 7.0 (H) 2.5 - 4.6 mg/dL  Troponin I     Status: Abnormal   Collection Time: 03/09/2016 11:41 PM  Result Value Ref Range   Troponin I 2.54 (HH) <0.03 ng/mL    Comment: CRITICAL RESULT CALLED TO, READ BACK BY AND VERIFIED WITH: LEBRON,Y RN 03/22/2016 0141 JORDANS   I-Stat Arterial Blood Gas, ED - (order at [facility] and [facility] only)     Status: Abnormal   Collection Time: 03/23/2016 11:58 PM  Result Value Ref Range   pH, Arterial 7.101 (LL) 7.350 - 7.450   pCO2 arterial 40.3 35.0 - 45.0 mmHg   pO2, Arterial 82.0 80.0 - 100.0 mmHg   Bicarbonate 13.1 (L) 20.0 - 24.0 mEq/L   TCO2 14 0 - 100 mmol/L   O2 Saturation 94.0 %    Acid-base deficit 17.0 (H) 0.0 - 2.0 mmol/L   Patient temperature 93.1 F    Collection site RADIAL, ALLEN'S TEST ACCEPTABLE    Drawn by Operator    Sample type ARTERIAL    Comment NOTIFIED PHYSICIAN   I-Stat Troponin, ED (not at [facility])     Status: Abnormal   Collection Time: 03/14/2016 12:01 AM  Result Value Ref Range   Troponin i, poc 1.30 (HH) 0.00 - 0.08 ng/mL   Comment NOTIFIED PHYSICIAN    Comment 3            Comment: Due to the release kinetics of cTnI, a negative result within the first hours of the onset of symptoms does not rule out myocardial infarction with certainty. If myocardial infarction is still suspected, repeat the test at appropriate intervals.   I-Stat CG4 Lactic Acid, ED     Status: Abnormal   Collection Time: 03/06/2016 12:03 AM  Result Value Ref Range   Lactic Acid, Venous 8.37 (HH) 0.5 - 1.9 mmol/L   Comment NOTIFIED PHYSICIAN   Urinalysis, Routine w reflex microscopic (not at [facility])     Status: Abnormal   Collection Time: 03/29/2016  1:20 AM  Result Value Ref Range   Color, Urine AMBER (A) YELLOW    Comment: BIOCHEMICALS MAY BE AFFECTED BY COLOR   APPearance TURBID (A) CLEAR   Specific Gravity, Urine 1.029 1.005 - 1.030   pH 5.5 5.0 - 8.0   Glucose, UA NEGATIVE NEGATIVE mg/dL   Hgb urine dipstick LARGE (A) NEGATIVE   Bilirubin Urine MODERATE (A) NEGATIVE   Ketones, ur 15 (A) NEGATIVE mg/dL   Protein, ur >300 (A) NEGATIVE mg/dL   Nitrite NEGATIVE NEGATIVE   Leukocytes, UA SMALL (A) NEGATIVE  Urine microscopic-add on     Status: Abnormal   Collection Time: 03/24/2016  1:20 AM  Result Value Ref Range   Squamous Epithelial / LPF 0-5 (A) NONE SEEN   WBC, UA 6-30 0 - 5 WBC/hpf   RBC / HPF TOO NUMEROUS TO COUNT 0 - 5 RBC/hpf   Bacteria, UA FEW (A) NONE SEEN   Casts HYALINE CASTS (A) NEGATIVE   Urine-Other LESS THAN 10 mL OF URINE SUBMITTED     Comment: MICROSCOPIC EXAM PERFORMED ON UNCONCENTRATED URINE  Strep pneumoniae urinary antigen  (not  at [facility])      Status: None   Collection Time: 03/13/2016  1:22 AM  Result Value Ref Range   Strep Pneumo Urinary Antigen NEGATIVE NEGATIVE    Comment:        Infection due to S. pneumoniae cannot be absolutely ruled out since the antigen present may be below the detection limit of the test.   Lactic acid, plasma     Status: Abnormal   Collection Time: 04/04/2016  2:16 AM  Result Value Ref Range   Lactic Acid, Venous 4.9 (HH) 0.5 - 1.9 mmol/L    Comment: CRITICAL RESULT CALLED TO, READ BACK BY AND VERIFIED WITH: WALSH,L RN 03/13/2016 0305 JORDANS   Procalcitonin     Status: None   Collection Time: 04/03/2016  2:16 AM  Result Value Ref Range   Procalcitonin 0.10 ng/mL    Comment:        Interpretation: PCT (Procalcitonin) <= 0.5 ng/mL: Systemic infection (sepsis) is not likely. Local bacterial infection is possible. (NOTE)         ICU PCT Algorithm               Non ICU PCT Algorithm    ----------------------------     ------------------------------         PCT < 0.25 ng/mL                 PCT < 0.1 ng/mL     Stopping of antibiotics            Stopping of antibiotics       strongly encouraged.               strongly encouraged.    ----------------------------     ------------------------------       PCT level decrease by               PCT < 0.25 ng/mL       >= 80% from peak PCT       OR PCT 0.25 - 0.5 ng/mL          Stopping of antibiotics                                             encouraged.     Stopping of antibiotics           encouraged.    ----------------------------     ------------------------------       PCT level decrease by              PCT >= 0.25 ng/mL       < 80% from peak PCT        AND PCT >= 0.5 ng/mL            Continuin g antibiotics                                              encouraged.       Continuing antibiotics            encouraged.    ----------------------------     ------------------------------     PCT level increase compared          PCT > 0.5 ng/mL  with peak PCT AND          PCT >= 0.5 ng/mL             Escalation of antibiotics                                          strongly encouraged.      Escalation of antibiotics        strongly encouraged.   I-Stat arterial blood gas, ED     Status: Abnormal   Collection Time: 04/03/2016  2:16 AM  Result Value Ref Range   pH, Arterial 7.004 (LL) 7.350 - 7.450   pCO2 arterial 62.8 (HH) 35.0 - 45.0 mmHg   pO2, Arterial 92.0 80.0 - 100.0 mmHg   Bicarbonate 16.0 (L) 20.0 - 24.0 mEq/L   TCO2 18 0 - 100 mmol/L   O2 Saturation 93.0 %   Acid-base deficit 16.0 (H) 0.0 - 2.0 mmol/L   Patient temperature 35.5 C    Collection site RADIAL, ALLEN'S TEST ACCEPTABLE    Drawn by Operator    Sample type ARTERIAL    Comment NOTIFIED PHYSICIAN   I-Stat CG4 Lactic Acid, ED     Status: Abnormal   Collection Time: 04/03/2016  2:26 AM  Result Value Ref Range   Lactic Acid, Venous 4.89 (HH) 0.5 - 1.9 mmol/L   Comment NOTIFIED PHYSICIAN   Glucose, capillary     Status: Abnormal   Collection Time: 03/08/2016  3:07 AM  Result Value Ref Range   Glucose-Capillary 417 (H) 65 - 99 mg/dL   Comment 1 Notify RN   Blood gas, arterial     Status: Abnormal   Collection Time: 03/05/2016  3:49 AM  Result Value Ref Range   FIO2 0.60    Delivery systems VENTILATOR    Mode PRESSURE REGULATED VOLUME CONTROL    VT 550 mL   LHR 24 resp/min   Peep/cpap 5.0 cm H20   pH, Arterial 7.073 (LL) 7.350 - 7.450    Comment: CRITICAL RESULT CALLED TO, READ BACK BY AND VERIFIED WITH: MIRIAM TURIFF RRT AT 0326 BY FRANCIS ALMONOR RRT,RCP ON 03/28/2016    pCO2 arterial 57.7 (HH) 35.0 - 45.0 mmHg    Comment: CRITICAL RESULT CALLED TO, READ BACK BY AND VERIFIED WITH: MIRIAM TURIFF AT 0356 BY FRANCIS ALMONOR RRT,RCP ON 04/03/2016    pO2, Arterial 84.9 80.0 - 100.0 mmHg   Bicarbonate 16.3 (L) 20.0 - 24.0 mEq/L   TCO2 18.1 0 - 100 mmol/L   Acid-base deficit 12.3 (H) 0.0 - 2.0 mmol/L   O2 Saturation 91.5 %   Patient temperature 97.4     Collection site RIGHT RADIAL    Sample type ARTERIAL DRAW    Allens test (pass/fail) PASS PASS  CBC     Status: Abnormal   Collection Time: 03/15/2016  5:20 AM  Result Value Ref Range   WBC 15.0 (H) 4.0 - 10.5 K/uL   RBC 3.11 (L) 4.22 - 5.81 MIL/uL   Hemoglobin 11.6 (L) 13.0 - 17.0 g/dL   HCT 33.7 (L) 39.0 - 52.0 %   MCV 108.4 (H) 78.0 - 100.0 fL   MCH 37.3 (H) 26.0 - 34.0 pg   MCHC 34.4 30.0 - 36.0 g/dL   RDW 13.7 11.5 - 15.5 %   Platelets 551 (H) 150 - 400 K/uL  Basic metabolic panel     Status:  Abnormal   Collection Time: 03/19/2016  5:20 AM  Result Value Ref Range   Sodium 132 (L) 135 - 145 mmol/L   Potassium 5.7 (H) 3.5 - 5.1 mmol/L    Comment: DELTA CHECK NOTED   Chloride 105 101 - 111 mmol/L   CO2 13 (L) 22 - 32 mmol/L   Glucose, Bld 346 (H) 65 - 99 mg/dL   BUN 25 (H) 6 - 20 mg/dL   Creatinine, Ser 1.96 (H) 0.61 - 1.24 mg/dL   Calcium 7.9 (L) 8.9 - 10.3 mg/dL   GFR calc non Af Amer 32 (L) >60 mL/min   GFR calc Af Amer 37 (L) >60 mL/min    Comment: (NOTE) The eGFR has been calculated using the CKD EPI equation. This calculation has not been validated in all clinical situations. eGFR's persistently <60 mL/min signify possible Chronic Kidney Disease.    Anion gap 14 5 - 15  Troponin I     Status: Abnormal   Collection Time: 03/18/2016  5:20 AM  Result Value Ref Range   Troponin I 10.57 (HH) <0.03 ng/mL    Comment: CRITICAL VALUE NOTED.  VALUE IS CONSISTENT WITH PREVIOUSLY REPORTED AND CALLED VALUE.  Lactic acid, plasma     Status: Abnormal   Collection Time: 03/14/2016  5:20 AM  Result Value Ref Range   Lactic Acid, Venous 6.5 (HH) 0.5 - 1.9 mmol/L    Comment: CRITICAL RESULT CALLED TO, READ BACK BY AND VERIFIED WITH: SCHRAM,M RN 03/07/2016 0604 JORDANS   Blood gas, arterial     Status: Abnormal   Collection Time: 03/05/2016  6:13 AM  Result Value Ref Range   FIO2 0.70    Delivery systems VENTILATOR    Mode PRESSURE REGULATED VOLUME CONTROL    VT 550 mL   LHR 28  resp/min   Peep/cpap 5.0 cm H20   pH, Arterial 7.042 (LL) 7.350 - 7.450    Comment: CRITICAL RESULT CALLED TO, READ BACK BY AND VERIFIED WITH: ROBIN DONAHUE,RN AT 0620 ON 03/17/2016 BY MIRIAM TURRIFF,RRT, RCP     pCO2 arterial 53.4 (H) 35.0 - 45.0 mmHg   pO2, Arterial 87.8 80.0 - 100.0 mmHg   Bicarbonate 13.8 (L) 20.0 - 24.0 mEq/L   TCO2 15.4 0 - 100 mmol/L   Acid-base deficit 14.9 (H) 0.0 - 2.0 mmol/L   O2 Saturation 91.5 %   Patient temperature 98.6    Collection site LEFT RADIAL    Drawn by [id number]    Sample type ARTERIAL DRAW    Allens test (pass/fail) PASS PASS  MRSA PCR Screening     Status: None   Collection Time: 03/21/2016  6:35 AM  Result Value Ref Range   MRSA by PCR NEGATIVE NEGATIVE    Comment:        The GeneXpert MRSA Assay (FDA approved for NASAL specimens only), is one component of a comprehensive MRSA colonization surveillance program. It is not intended to diagnose MRSA infection nor to guide or monitor treatment for MRSA infections.   Glucose, capillary     Status: Abnormal   Collection Time: 03/17/2016  7:49 AM  Result Value Ref Range   Glucose-Capillary 363 (H) 65 - 99 mg/dL   Comment 1 Notify RN   Procalcitonin     Status: None   Collection Time: 03/22/2016  8:00 AM  Result Value Ref Range   Procalcitonin 2.96 ng/mL    Comment:        Interpretation: PCT > 2 ng/mL: Systemic infection (sepsis)  is likely, unless other causes are known. (NOTE)         ICU PCT Algorithm               Non ICU PCT Algorithm    ----------------------------     ------------------------------         PCT < 0.25 ng/mL                 PCT < 0.1 ng/mL     Stopping of antibiotics            Stopping of antibiotics       strongly encouraged.               strongly encouraged.    ----------------------------     ------------------------------       PCT level decrease by               PCT < 0.25 ng/mL       >= 80% from peak PCT       OR PCT 0.25 - 0.5 ng/mL          Stopping of  antibiotics                                             encouraged.     Stopping of antibiotics           encouraged.    ----------------------------     ------------------------------       PCT level decrease by              PCT >= 0.25 ng/mL       < 80% from peak PCT        AND PCT >= 0.5 ng/mL            Continuing antibiotics                                               encouraged.       Continuing antibiotics            encouraged.    ----------------------------     ------------------------------     PCT level increase compared          PCT > 0.5 ng/mL         with peak PCT AND          PCT >= 0.5 ng/mL             Escalation of antibiotics                                          strongly encouraged.      Escalation of antibiotics        strongly encouraged.   I-STAT 3, arterial blood gas (G3+)     Status: Abnormal   Collection Time: 03/08/2016  8:46 AM  Result Value Ref Range   pH, Arterial 7.032 (LL) 7.350 - 7.450   pCO2 arterial 52.3 (H) 35.0 - 45.0 mmHg   pO2, Arterial 75.0 (L) 80.0 - 100.0 mmHg   Bicarbonate 14.0 (L) 20.0 - 24.0 mEq/L   TCO2 16 0 - 100  mmol/L   O2 Saturation 87.0 %   Acid-base deficit 17.0 (H) 0.0 - 2.0 mmol/L   Patient temperature 36.5 C    Collection site RADIAL, ALLEN'S TEST ACCEPTABLE    Drawn by RT    Sample type ARTERIAL    Comment NOTIFIED PHYSICIAN   Basic metabolic panel     Status: Abnormal   Collection Time: 03/13/2016  9:35 AM  Result Value Ref Range   Sodium 135 135 - 145 mmol/L   Potassium 4.7 3.5 - 5.1 mmol/L   Chloride 105 101 - 111 mmol/L   CO2 16 (L) 22 - 32 mmol/L   Glucose, Bld 304 (H) 65 - 99 mg/dL   BUN 27 (H) 6 - 20 mg/dL   Creatinine, Ser 2.42 (H) 0.61 - 1.24 mg/dL   Calcium 7.9 (L) 8.9 - 10.3 mg/dL   GFR calc non Af Amer 25 (L) >60 mL/min   GFR calc Af Amer 29 (L) >60 mL/min    Comment: (NOTE) The eGFR has been calculated using the CKD EPI equation. This calculation has not been validated in all clinical  situations. eGFR's persistently <60 mL/min signify possible Chronic Kidney Disease.    Anion gap 14 5 - 15  Heparin level (unfractionated)     Status: Abnormal   Collection Time: 03/20/2016 10:50 AM  Result Value Ref Range   Heparin Unfractionated 0.21 (L) 0.30 - 0.70 IU/mL    Comment:        IF HEPARIN RESULTS ARE BELOW EXPECTED VALUES, AND PATIENT DOSAGE HAS BEEN CONFIRMED, SUGGEST FOLLOW UP TESTING OF ANTITHROMBIN III LEVELS.   Troponin I     Status: Abnormal   Collection Time: 04/04/2016 12:12 PM  Result Value Ref Range   Troponin I >65.00 (HH) <0.03 ng/mL    Comment: CRITICAL RESULT CALLED TO, READ BACK BY AND VERIFIED WITH: FLOOD,L RN @ [id number] 03/20/2016 LEONARD,A     Dg Chest Portable 1 View  Result Date: 03/19/2016 CLINICAL DATA:  75 year old male with central line placement. EXAM: PORTABLE CHEST 1 VIEW COMPARISON:  Chest radiograph dated 03/05/2016 FINDINGS: There has been interval placement of a left subclavian central line with tip over central SVC. The endotracheal and enteric tubes are in stable positioning. Emphysematous changes of the lungs with bilateral patchy ground-glass densities similar to prior study. The cardiac silhouette is within normal limits. No acute osseous pathology. IMPRESSION: Interval placement of a left subclavian central venous catheter with tip over central SVC. No pneumothorax. Other support devices in stable positioning. Emphysema with bilateral patchy airspace densities as seen on the prior study. Follow-up recommended. Electronically Signed   By: Anner Crete M.D.   On: 03/09/2016 02:00  Dg Chest Portable 1 View  Result Date: 03/23/2016 CLINICAL DATA:  Intubation EXAM: PORTABLE CHEST 1 VIEW COMPARISON:  03/22/2016; 01/11/2015; 05/29/2014 FINDINGS: Examination is degraded secondary to exclusion of the bilateral costophrenic angles. The endotracheal tube overlies the tracheal air column with tip approximately 2.4 cm above the carina. Enteric tube tip  and side port project below the midline of the lower thorax. No definite pneumothorax. Grossly unchanged cardiac silhouette and mediastinal contours. Atherosclerotic plaque within thoracic aorta. The pulmonary vasculature is indistinct with cephalization of flow. Worsening perihilar heterogeneous airspace opacities. No definite pleural effusion, though note, the bilateral costophrenic angles are excluded from view. Unchanged bones. IMPRESSION: 1. Endotracheal tube tip overlies the tracheal air column, approximately 2.4 cm above the carina. No pneumothorax. 2. Enteric tube projects below the midline of the lower thorax,  tip excluded from view. 3. Similar findings of suspected pulmonary edema with worsening perihilar opacities with differential considerations include atelectasis, worsening alveolar pulmonary edema and aspiration. 4.  Aortic Atherosclerosis (ICD10-170.0) Electronically Signed   By: Sandi Mariscal M.D.   On: 03/27/2016 00:40  Dg Chest Portable 1 View  Result Date: 03/26/2016 CLINICAL DATA:  75 year old male with shortness of breath EXAM: PORTABLE CHEST 1 VIEW COMPARISON:  Chest radiograph dated 01/11/2015 FINDINGS: Single portable view of the chest demonstrates emphysematous changes of the lungs with interstitial coarsening, progressed compared to prior study. Areas of hazy density in the perihilar region with Kerley B-lines suggest mild congestion and edema. Superimposed pneumonia is not excluded. Clinical correlation is recommended. There is no focal consolidation. No pneumothorax. The cardiac silhouette is within normal limits with no acute osseous pathology. IMPRESSION: Mild pulmonary vascular congestion and interstitial edema on the background of emphysema. Pneumonia is not excluded. Clinical correlation is recommended. Electronically Signed   By: Anner Crete M.D.   On: 03/22/2016 00:11   ROS Blood pressure 91/66, pulse (!) 131, temperature 98.8 F (37.1 C), resp. rate (!) 30, height 5'  9" (1.753 m), weight 139 lb 8.8 oz (63.3 kg), SpO2 92 %. Physical Exam General: Intubated, bear hugger Pale, thin HEENT: [place]/AT  Fundi benign                 Neck: Supple. No tracheal deviation. PCL Lungs: Ventilator supportive breath sounds. Diffuse crackles, rhonchi Heart: Reg rhythm, tachy rate. No murmurs. Gr 2/6 sys M Abdomen: Soft, non-tender, non-distended, +BS Extremities: No peripheral edema  Assessment/Plan:  AKI Patient is presenting with acute kidney injury due to decreased renal perfusion in the setting of cardiogenic shock. He was tachycardic and hypotensive on admission. EKG showing LBBB (new?) and troponin up to >65. No prior history of kidney disease per family and chart showing normal renal function in September 2016.  Blood pressure continues to be soft at present. Creatinine has been worsening since admission 1.7 > 1.9 > 2.4. Phosphorus high (7.0). Potassium was transiently high at 5.7; now back normal after administration of Kayexalate. Lactic acid elevated and bicarbonate low. He is anuric in the setting of shock. CRRT has been initiated as patient has several emergent indications for dialysis including lactic acidosis and electrolyte abnormalities.  -CRRT: Goal is to remove acid and solutes; no volume will be taken off. BFR 150-200 mL per hour.Control K -Renal function panel -Renal ultrasound  Cardiogenic shock Patient presented to the hospital hypoxic, hypotensive, tachycardic. EKG showing LBBB (new?) Initial troponin 2.5, rose to >65 through the day today. Chest x-ray showing mild pulmonary vascular congestion and interstitial edema on the background of emphysema. Echo done today showing left ventricular ejection fraction 10-15%. Differentials include acute exacerbation of COPD, pneumonia, PE, and MI. Patient is currently on heparin infusion for empiric treatment of PE.  -Cardiology has seen the patient and plan is to take him to the cath lab for angiography and placement  of Impella.  -On Heparin infusion -On pressors  PNA? WBCs elevated. Covering for pneumonia with broad-spectrum antibiotics.   Hx of Hypertension Now hypotensive in the setting of cardiogenic shock. -Hold home blood pressure meds  History of hyperlipidemia -Pravastatin 20 mg daily PVD I have seen and examined this patient and agree with the plan of care seen, examined, eval, discussed with resident, CCM, nursing staff. .  Thomas Mendoza L 04/04/2016, 4:49 PM

## 2016-03-31 NOTE — Progress Notes (Signed)
ANTICOAGULATION CONSULT NOTE - Initial Consult  Pharmacy Consult for Heparin  Indication: chest pain/ACS  No Known Allergies  Patient Measurements: Height: 5\' 9"  (175.3 cm) Weight: 139 lb 8.8 oz (63.3 kg) IBW/kg (Calculated) : 70.7 Heparin Dosing Weight: 63 kg  Vital Signs: Temp: 97.9 F (36.6 C) (07/27 0900) Temp Source: Oral (07/27 0318) BP: 85/69 (07/27 1131) Pulse Rate: 144 (07/27 1131)  Labs:  Recent Labs  04/02/2016 2341 2016/04/17 0520 04/17/16 0935 April 17, 2016 1050  HGB 12.5* 11.6*  --   --   HCT 37.8* 33.7*  --   --   PLT 327 551*  --   --   HEPARINUNFRC  --   --   --  0.21*  CREATININE 1.72* 1.96* 2.42*  --   TROPONINI 2.54* 10.57*  --   --     Estimated Creatinine Clearance: 24 mL/min (by C-G formula based on SCr of 2.42 mg/dL).   Medical History: Past Medical History:  Diagnosis Date  . Depression   . Emphysema of lung ([facility])   . Hyperlipidemia   . Hypertension     Medications:  Infusions:  . dextrose 5 % and 0.45% NaCl    . fentaNYL infusion INTRAVENOUS 50 mcg/hr (04/17/2016 0951)  . heparin 10,000 units/ 20 mL infusion syringe    . heparin 750 Units/hr (04-17-2016 0300)  . insulin (NOVOLIN-R) infusion 11.9 Units/hr (17-Apr-2016 1023)  . norepinephrine (LEVOPHED) Adult infusion 25 mcg/min (Apr 17, 2016 0324)  . dialysis replacement fluid (prismasate)    . dialysis replacement fluid (prismasate)    . dialysate (PRISMASATE)    .  sodium bicarbonate  infusion 1000 mL 125 mL/hr at Apr 17, 2016 0754  . vasopressin (PITRESSIN) infusion - *FOR SHOCK* 0.03 Units/min (Apr 17, 2016 1113)    Assessment: Pt presented to ED in respiratory distress found to have elevated troponin (2.54). Differiential diagnosis is less concerning for PE however not able to obtain CTA chest due to AKI. PT has new LBBB diagnosis. Heparin level (0.21) is below target of 0.3-0.7. Prior to admission patient was not taking anticoagulation. HgB 11.6 and platelets 551   Goal of Therapy:  Heparin level  0.3-0.7 units/ml Monitor platelets by anticoagulation protocol: Yes   Plan:  Increased heparin 750 units/hr >> 900 units/hr Heparin level ordered for 2000 7/27   Ruben Im, PharmD 2016/04/17

## 2016-03-31 NOTE — Interval H&P Note (Signed)
History and Physical Interval Note:  03/15/2016 3:30 PM  Thomas Mendoza  has presented today for cardiac cath, placement of LV support device with the diagnosis of shock, acute coronary syndrome.  The various methods of treatment have been discussed with the patient and family. After consideration of risks, benefits and other options for treatment, the patient has consented to  Procedure(s): Right/Left Heart Cath and Coronary Angiography (N/A) Ventricular Assist Device Insertion (N/A) as a surgical intervention .  The patient's history has been reviewed, patient examined, no change in status, stable for surgery.  I have reviewed the patient's chart and labs.  Questions were answered to the patient's satisfaction.    Cath Lab Visit (complete for each Cath Lab visit)  Clinical Evaluation Leading to the Procedure:   ACS: Yes.    Non-ACS:    Anginal Classification: CCS IV  Anti-ischemic medical therapy: No Therapy  Non-Invasive Test Results: No non-invasive testing performed  Prior CABG: No previous CABG         Verne Carrow

## 2016-03-31 NOTE — Progress Notes (Signed)
ANTICOAGULATION CONSULT NOTE - Follow Up Consult  Pharmacy Consult for heparin Indication: chest pain/ACS  No Known Allergies  Patient Measurements: Height: 5' 9"  (175.3 cm) Weight: 139 lb 8.8 oz (63.3 kg) IBW/kg (Calculated) : 70.7 Heparin Dosing Weight: 63 kg  Vital Signs: Temp: 98.4 F (36.9 C) (07/27 1845) Temp Source: Oral (07/27 1748) BP: 94/69 (07/27 1845) Pulse Rate: 111 (07/27 1845)  Labs:  Recent Labs  03/18/2016 2341 03/28/2016 0520 03/10/2016 0935 03/28/2016 1050 03/21/2016 1212 03/12/2016 1441  HGB 12.5* 11.6*  --   --   --   --   HCT 37.8* 33.7*  --   --   --   --   PLT 327 551*  --   --   --   --   HEPARINUNFRC  --   --   --  0.21*  --   --   CREATININE 1.72* 1.96* 2.42*  --   --  2.76*  TROPONINI 2.54* 10.57*  --   --  >65.00*  --     Estimated Creatinine Clearance: 21 mL/min (by C-G formula based on SCr of 2.76 mg/dL).   Medications:  Scheduled:  . antiseptic oral rinse  7 mL Mouth Rinse QID  . aspirin  81 mg Per Tube Daily  . [START ON 04/13/16] aspirin  81 mg Oral Pre-Cath  . azithromycin  500 mg Intravenous QHS  . cefTAZidime (FORTAZ)  IV  2 g Intravenous Q12H  . chlorhexidine gluconate (SAGE KIT)  15 mL Mouth Rinse BID  . [START ON April 13, 2016] famotidine (PEPCID) IV  20 mg Intravenous Q24H  . insulin regular  0-10 Units Intravenous TID WC  . ipratropium-albuterol  3 mL Nebulization Q6H  . methylPREDNISolone (SOLU-MEDROL) injection  80 mg Intravenous Q8H  . pravastatin  20 mg Per Tube q1800  . sodium chloride flush  3 mL Intravenous Q12H  . sodium chloride flush  3 mL Intravenous Q12H  . sodium chloride flush  3 mL Intravenous Q12H  . sodium chloride flush  3 mL Intravenous Q12H  . [START ON Apr 13, 2016] vancomycin  500 mg Intravenous Q24H   Infusions:  . [START ON 04/13/2016] sodium chloride    . dextrose 5 % and 0.45% NaCl    . fentaNYL infusion INTRAVENOUS 50 mcg/hr (03/06/2016 1800)  . heparin 10,000 units/ 20 mL infusion syringe    . insulin  (NOVOLIN-R) infusion 8 Units/hr (03/18/2016 1745)  . norepinephrine (LEVOPHED) Adult infusion 36 mcg/min (03/29/2016 1800)  . dialysis replacement fluid (prismasate)    . dialysis replacement fluid (prismasate)    . dialysate (PRISMASATE)    .  sodium bicarbonate  infusion 1000 mL 125 mL/hr at 03/08/2016 1422  . vasopressin (PITRESSIN) infusion - *FOR SHOCK* 0.03 Units/min (04/02/2016 1800)    Assessment: 75 yo male with ACS s/p cath will be resumed on heparin 8hr post sheath removal.  Sheath was removed at ~1721.   Goal of Therapy:  Heparin level 0.3-0.7 units/ml Monitor platelets by anticoagulation protocol: Yes   Plan:  - Resume heparin at 900 units/hr at 0130 on 07/28 - 8hr heparin level  Zyshawn Bohnenkamp, Tsz-Yin 03/18/2016,7:02 PM

## 2016-04-01 ENCOUNTER — Inpatient Hospital Stay (HOSPITAL_COMMUNITY): Payer: Medicare Other

## 2016-04-01 ENCOUNTER — Encounter (HOSPITAL_COMMUNITY): Payer: Self-pay | Admitting: Cardiovascular Disease

## 2016-04-01 DIAGNOSIS — I2101 ST elevation (STEMI) myocardial infarction involving left main coronary artery: Secondary | ICD-10-CM

## 2016-04-01 LAB — RENAL FUNCTION PANEL
ANION GAP: 16 — AB (ref 5–15)
Albumin: 2.2 g/dL — ABNORMAL LOW (ref 3.5–5.0)
BUN: 38 mg/dL — ABNORMAL HIGH (ref 6–20)
CALCIUM: 6.9 mg/dL — AB (ref 8.9–10.3)
CHLORIDE: 95 mmol/L — AB (ref 101–111)
CO2: 23 mmol/L (ref 22–32)
CREATININE: 3.74 mg/dL — AB (ref 0.61–1.24)
GFR, EST AFRICAN AMERICAN: 17 mL/min — AB (ref >60)
GFR, EST NON AFRICAN AMERICAN: 15 mL/min — AB (ref >60)
Glucose, Bld: 79 mg/dL (ref 65–99)
Phosphorus: 8.4 mg/dL — ABNORMAL HIGH (ref 2.5–4.6)
Potassium: 5.2 mmol/L — ABNORMAL HIGH (ref 3.5–5.1)
SODIUM: 134 mmol/L — AB (ref 135–145)

## 2016-04-01 LAB — CBC
HEMATOCRIT: 37.1 % — AB (ref 39.0–52.0)
Hemoglobin: 12 g/dL — ABNORMAL LOW (ref 13.0–17.0)
MCH: 34.9 pg — AB (ref 26.0–34.0)
MCHC: 32.3 g/dL (ref 30.0–36.0)
MCV: 107.8 fL — AB (ref 78.0–100.0)
Platelets: 169 10*3/uL (ref 150–400)
RBC: 3.44 MIL/uL — ABNORMAL LOW (ref 4.22–5.81)
RDW: 12.8 % (ref 11.5–15.5)
WBC: 14.3 10*3/uL — ABNORMAL HIGH (ref 4.0–10.5)

## 2016-04-01 LAB — GLUCOSE, CAPILLARY
GLUCOSE-CAPILLARY: 159 mg/dL — AB (ref 65–99)
GLUCOSE-CAPILLARY: 116 mg/dL — AB (ref 65–99)
GLUCOSE-CAPILLARY: 95 mg/dL (ref 65–99)
GLUCOSE-CAPILLARY: 82 mg/dL (ref 65–99)
GLUCOSE-CAPILLARY: 123 mg/dL — AB (ref 65–99)
GLUCOSE-CAPILLARY: 147 mg/dL — AB (ref 65–99)
Glucose-Capillary: 108 mg/dL — ABNORMAL HIGH (ref 65–99)
Glucose-Capillary: 136 mg/dL — ABNORMAL HIGH (ref 65–99)
Glucose-Capillary: 172 mg/dL — ABNORMAL HIGH (ref 65–99)
Glucose-Capillary: 178 mg/dL — ABNORMAL HIGH (ref 65–99)
Glucose-Capillary: 221 mg/dL — ABNORMAL HIGH (ref 65–99)
Glucose-Capillary: 90 mg/dL (ref 65–99)

## 2016-04-01 LAB — BASIC METABOLIC PANEL
Anion gap: 14 (ref 5–15)
Anion gap: 16 — ABNORMAL HIGH (ref 5–15)
BUN: 36 mg/dL — AB (ref 6–20)
BUN: 38 mg/dL — AB (ref 6–20)
CALCIUM: 7 mg/dL — AB (ref 8.9–10.3)
CHLORIDE: 99 mmol/L — AB (ref 101–111)
CHLORIDE: 95 mmol/L — AB (ref 101–111)
CO2: 21 mmol/L — AB (ref 22–32)
CO2: 23 mmol/L (ref 22–32)
CREATININE: 3.56 mg/dL — AB (ref 0.61–1.24)
CREATININE: 3.83 mg/dL — AB (ref 0.61–1.24)
Calcium: 6.9 mg/dL — ABNORMAL LOW (ref 8.9–10.3)
GFR calc Af Amer: 18 mL/min — ABNORMAL LOW (ref >60)
GFR calc Af Amer: 16 mL/min — ABNORMAL LOW (ref >60)
GFR calc non Af Amer: 16 mL/min — ABNORMAL LOW (ref >60)
GFR calc non Af Amer: 14 mL/min — ABNORMAL LOW (ref >60)
GLUCOSE: 143 mg/dL — AB (ref 65–99)
Glucose, Bld: 80 mg/dL (ref 65–99)
Potassium: 4.8 mmol/L (ref 3.5–5.1)
Potassium: 5.2 mmol/L — ABNORMAL HIGH (ref 3.5–5.1)
Sodium: 134 mmol/L — ABNORMAL LOW (ref 135–145)
Sodium: 134 mmol/L — ABNORMAL LOW (ref 135–145)

## 2016-04-01 LAB — HEPATIC FUNCTION PANEL
ALK PHOS: 74 U/L (ref 38–126)
ALT: 5381 U/L — AB (ref 17–63)
AST: 8997 U/L — ABNORMAL HIGH (ref 15–41)
Albumin: 2.2 g/dL — ABNORMAL LOW (ref 3.5–5.0)
BILIRUBIN INDIRECT: 0.8 mg/dL (ref 0.3–0.9)
Bilirubin, Direct: 0.3 mg/dL (ref 0.1–0.5)
Total Bilirubin: 1.1 mg/dL (ref 0.3–1.2)
Total Protein: 4.9 g/dL — ABNORMAL LOW (ref 6.5–8.1)

## 2016-04-01 LAB — URINE CULTURE: Culture: NO GROWTH

## 2016-04-01 LAB — HEMOGLOBIN A1C
Hgb A1c MFr Bld: 5.5 % (ref 4.8–5.6)
MEAN PLASMA GLUCOSE: 111 mg/dL

## 2016-04-01 LAB — HEPARIN LEVEL (UNFRACTIONATED): Heparin Unfractionated: <0.1 IU/mL — ABNORMAL LOW (ref 0.30–0.70)

## 2016-04-01 LAB — APTT: APTT: 48 s — AB (ref 24–36)

## 2016-04-01 LAB — LEGIONELLA PNEUMOPHILA SEROGP 1 UR AG: L. PNEUMOPHILA SEROGP 1 UR AG: NEGATIVE

## 2016-04-01 LAB — PROCALCITONIN: Procalcitonin: 20.6 ng/mL

## 2016-04-01 LAB — MAGNESIUM: Magnesium: 2.5 mg/dL — ABNORMAL HIGH (ref 1.7–2.4)

## 2016-04-01 LAB — PHOSPHORUS: Phosphorus: 8.4 mg/dL — ABNORMAL HIGH (ref 2.5–4.6)

## 2016-04-01 MED ORDER — VANCOMYCIN HCL IN DEXTROSE 1-5 GM/200ML-% IV SOLN: 1000.0000 mg | INTRAVENOUS | Status: DC

## 2016-04-01 MED ORDER — SODIUM CHLORIDE 0.9 % IV SOLN
100.0000 ug/h | INTRAVENOUS | Status: DC
  Filled 2016-04-01: qty 50

## 2016-04-01 MED ORDER — DOBUTAMINE IN D5W 4-5 MG/ML-% IV SOLN
3.0000 ug/kg/min | INTRAVENOUS | Status: DC
  Administered 2016-04-01: 3 ug/kg/min via INTRAVENOUS
  Filled 2016-04-01: qty 250

## 2016-04-01 MED ORDER — ATORVASTATIN CALCIUM 80 MG PO TABS: 80.0000 mg | ORAL_TABLET | Freq: Every day | ORAL | Status: DC

## 2016-04-01 MED ORDER — FENTANYL BOLUS VIA INFUSION
50.0000 ug | INTRAVENOUS | Status: DC | PRN
  Filled 2016-04-01: qty 200

## 2016-04-01 MED ORDER — VANCOMYCIN HCL 500 MG IV SOLR: 500.0000 mg | INTRAVENOUS | Status: DC

## 2016-04-01 MED ORDER — MIDAZOLAM BOLUS VIA INFUSION (WITHDRAWAL LIFE SUSTAINING TX)
5.0000 mg | INTRAVENOUS | Status: DC | PRN
  Filled 2016-04-01: qty 20

## 2016-04-01 MED ORDER — SODIUM CHLORIDE 0.9 % IV SOLN
10.0000 mg/h | INTRAVENOUS | Status: DC
  Administered 2016-04-01: 10 mg/h via INTRAVENOUS
  Filled 2016-04-01: qty 10

## 2016-04-01 MED ORDER — DEXTROSE 5 % IV SOLN
2.0000 g | INTRAVENOUS | Status: DC
  Filled 2016-04-01: qty 2

## 2016-04-02 LAB — BLOOD CULTURE ID PANEL (REFLEXED)
ACINETOBACTER BAUMANNII: NOT DETECTED
CANDIDA GLABRATA: NOT DETECTED
CANDIDA TROPICALIS: NOT DETECTED
Candida albicans: NOT DETECTED
Candida krusei: NOT DETECTED
Candida parapsilosis: NOT DETECTED
Carbapenem resistance: NOT DETECTED
ESCHERICHIA COLI: NOT DETECTED
Enterobacter cloacae complex: NOT DETECTED
Enterobacteriaceae species: NOT DETECTED
Enterococcus species: NOT DETECTED
HAEMOPHILUS INFLUENZAE: NOT DETECTED
Klebsiella oxytoca: NOT DETECTED
Klebsiella pneumoniae: NOT DETECTED
LISTERIA MONOCYTOGENES: NOT DETECTED
METHICILLIN RESISTANCE: NOT DETECTED
Neisseria meningitidis: NOT DETECTED
PROTEUS SPECIES: NOT DETECTED
Pseudomonas aeruginosa: NOT DETECTED
SERRATIA MARCESCENS: NOT DETECTED
Staphylococcus aureus (BCID): NOT DETECTED
Staphylococcus species: DETECTED — AB
Streptococcus agalactiae: NOT DETECTED
Streptococcus pneumoniae: NOT DETECTED
Streptococcus pyogenes: NOT DETECTED
Streptococcus species: NOT DETECTED
VANCOMYCIN RESISTANCE: NOT DETECTED

## 2016-04-04 LAB — CULTURE, BLOOD (ROUTINE X 2)

## 2016-04-04 LAB — GLUCOSE, CAPILLARY

## 2016-04-05 ENCOUNTER — Telehealth: Payer: Self-pay

## 2016-04-05 LAB — CULTURE, BLOOD (ROUTINE X 2)
CULTURE: NO GROWTH
CULTURE: NO GROWTH
Culture: NO GROWTH

## 2016-04-05 NOTE — [facility] Notes (Signed)
   [facility] PCCM  I extensively d/w with pt's wife and son and the rest of the family re: over all poor prognosis and condition.  I discussed with them what cards, renal, and surgery said re: pt's case.  Wife and the rest of the family have decided to withdraw care BUT they want pt's daughter to see him first before we make him full comfort care.   They will coordinate with the rest of family to let pts daughter see pt. After that, we will transition to full comfort care.  He currently  looks comfortable on fentanyl drip > likely will just keep it with prn fentanyl. I anticipate he will not last long once extubated  as he is on high doses of pressors  J. Alexis Frock, MD 2016-04-30, 10:29 AM [facility] Pager [phone] After 3 pm or if no answer, call [phone]

## 2016-04-05 NOTE — [facility] Notes (Signed)
Family notified RN that everyone was at bedside and ready to begin switch to comfort care.  Dr. Christene Slates notified and orders received.  Respiratory Therapist and pharmacy notified.  [employer],  Received call back from [place], pt is not candidate for donation of organs.  Will call back with time of death.

## 2016-04-05 NOTE — Progress Notes (Signed)
  Pharmacy Antibiotic Note  Thomas Mendoza is a 75 y.o. male admitted on 03/31/2016 with cardiogenic shock.  Pharmacy has been consulted for ceftazidime and vancomycin dosing for possible HCAP - day #2. Low grade fevers, lactic acid and PCT trending up, wbc down 14.3.   Anuric, SCr up 1.72>3.83 since admit, CrCl~16. CRRT bags ordered, not yet started pending GOC discussion. Will adjust abx for worsening renal function this AM.  Plan: Ceftazidime to 2g IV Q24h Azithromycin 500mg  IV q24h Vancomycin to 1g IV q48h (may need to check VR prior to 7/29 dose if CRRT not initiated) F/u c/s, renal function, vanc levels as indicated F/u Renal plans, GOC discussions   Height: 5\' 9"  (175.3 cm) Weight: 153 lb (69.4 kg) IBW/kg (Calculated) : 70.7  Temp (24hrs), Avg:99.3 F (37.4 C), Min:98.1 F (36.7 C), Max:100.4 F (38 C)   Recent Labs Lab 03/25/2016 2341 03/13/2016 0003 03/05/2016 0216 04/02/2016 0226 03/30/2016 0520  03/23/2016 1441 03/13/2016 1845 03/15/2016 2105 2016/04/10 0120 Apr 10, 2016 0600  WBC 13.2*  --   --   --  15.0*  --   --   --   --   --  14.3*  CREATININE 1.72*  --   --   --  1.96*  < > 2.76* 3.06* 3.10* 3.56* 3.83*  3.74*  LATICACIDVEN  --  8.37* 4.9* 4.89* 6.5*  --   --   --   --   --   --   < > = values in this interval not displayed.  Estimated Creatinine Clearance: 16.6 mL/min (by C-G formula based on SCr of 3.83 mg/dL).    No Known Allergies  Antimicrobials this admission: 7/27 Zosyn x 1 7/27 Rocephin x1 7/27 Azith >> 7/27 Ceftaz>> 7/27 Vanc >>  Dose adjustments this admission: n/a  Microbiology results: 7/26 BCx x2:  7/26 UCx:  neg 7/27 MRSA PCR: Negative   Babs Bertin, PharmD, BCPS Clinical Pharmacist Pager [phone] 2016/04/10 9:07 AM

## 2016-04-05 NOTE — [facility] Notes (Signed)
   03/22/2016 1100  Clinical Encounter Type  Visited With Patient and family together  Visit Type Initial  Referral From Nurse  Consult/Referral To Chaplain  Spiritual Encounters  Spiritual Needs Emotional;Prayer  Stress Factors  Patient Stress Factors Loss of control  Family Stress Factors Loss of control  CHP visited with patient and family, provided emotional support and prayer. Rodney Booze 03/20/2016

## 2016-04-05 NOTE — [facility] Notes (Addendum)
Fentanyl and Versed drips started as charted. At 1315 RT came to room to extubate pt.  All other drips stopped.  Family at bedside after extubation.  Pt resting comfortably. No signs of distress.

## 2016-04-05 NOTE — [facility] Notes (Signed)
PULMONARY / CRITICAL CARE MEDICINE   Name: Thomas Mendoza MRN: [id number] DOB: 1941/01/05    ADMISSION DATE:  April 24, 2016 CONSULTATION DATE:  03/18/2016  REFERRING MD:  [facility] - EDP  CHIEF COMPLAINT:  SOB  HISTORY OF PRESENT ILLNESS:  Pt is encephelopathic; therefore, this HPI is obtained from chart review. Thomas Mendoza is a 75 y.o. male with PMH as outlined below including COPD (no PFT's in system).  He was in his usual state of health up until evening of 7/26 just before he and his wife were getting ready to go to bed.  Wife states as she was getting changed, he called out for her and complained of SOB.  Symptoms worsened to the point that she had to call EMS.  He had very mild SOB but per wife, symptoms suddenly worsened as he called out for her.  Pt tried to use his inhaler while EMS were on the way but got no relief.  On EMS arrival, he was place don CPAP and was given IM epi along with solumedrol and BD's.  On ED arrival, he remained hypoxic but was also tachycardic and hypotensive.  Due to respiratory distress, he required intubation in ED.  In addition, he was hypothermic, had elevated lactate (8.37), elevated troponin (2.54) and new LBBB.  Due to AKI, he was unable to get CTA of the chest.  Given his sudden onset of symptoms, he was started on empiric heparin for possible PE.  Due to troponin bump along with new LBBB, cardiology was also called by EDP.   SUBJECTIVE:  Clinically worsened overnight with hypotension. On levophed, vasopressin.  On CVVH. Had LHC with severe 3VD and inoperable per TCVS.    VITAL SIGNS: BP 93/73   Pulse (!) 119   Temp 100.2 F (37.9 C)   Resp (!) 30   Ht  (1.753 m)   Wt 69.4 kg (153 lb)   SpO2 97%   BMI 22.59 kg/m   HEMODYNAMICS: PAP: (37-55)/(25-36) 55/33 CVP:  [8 mmHg-16 mmHg] 16 mmHg PCWP:  [28 mmHg] 28 mmHg CO:  [2.7 L/min] 2.7 L/min CI:  [1.5 L/min/m2] 1.5 L/min/m2  VENTILATOR SETTINGS: Vent Mode: PRVC FiO2 (%):  [40 %-60  %] 60 % Set Rate:  [30 bmp] 30 bmp Vt Set:  [560 mL-630 mL] 560 mL PEEP:  [5 cmH20] 5 cmH20 Plateau Pressure:  [20 cmH20-27 cmH20] 27 cmH20  INTAKE / OUTPUT: I/O last 3 completed shifts: In: 10506.6 [I.V.:9466.6; NG/GT:90; IV Piggyback:950] Out: 25 [Urine:25]   PHYSICAL EXAMINATION: General: Chronically ill appearing male, vent. Sedated. Follows simple commands on and off Neuro: Sedated, CN grossly intact. (-) lateralizing signs noted.  HEENT: [place]/AT. PERRL, sclerae anicteric Cardiovascular: soft s1 s2 RRT, no M/R/G  Lungs: fair ae. Wheeze at the bases. Crackles at bases.  Abdomen: dec BS, soft, No r/g Musculoskeletal: No gross deformities, no edema.  Skin: Intact, warm, no rashes. Gen; hernia rt. Cool extremities distally.   LABS:  BMET  Recent Labs Lab 03/18/2016 2105 03/09/2016 0120 03/14/2016 0600  NA 133* 134* 134*  134*  K 4.5 4.8 5.2*  5.2*  CL 102 99* 95*  95*  CO2 22 21* 23  23  BUN 33* 36* 38*  38*  CREATININE 3.10* 3.56* 3.83*  3.74*  GLUCOSE 155* 143* 80  79    Electrolytes  Recent Labs Lab 04/24/16 2341  03/13/2016 2105 04/03/2016 0120 03/13/2016 0600  CALCIUM 9.4  < > 7.2* 7.0* 6.9*  6.9*  MG  2.6*  --   --   --  2.5*  PHOS 7.0*  --   --   --  8.4*  8.4*  < > = values in this interval not displayed.  CBC  Recent Labs Lab 03/23/2016 2341 03/08/2016 0520 04-03-2016 0600  WBC 13.2* 15.0* 14.3*  HGB 12.5* 11.6* 12.0*  HCT 37.8* 33.7* 37.1*  PLT 327 551* 169    Coag's  Recent Labs Lab 2016/04/03 0600  APTT 48*    Sepsis Markers  Recent Labs Lab 03/27/2016 0216 03/09/2016 0226 03/07/2016 0520 03/12/2016 0800  LATICACIDVEN 4.9* 4.89* 6.5*  --   PROCALCITON 0.10  --   --  2.96    ABG  Recent Labs Lab 03/19/2016 0846 03/07/2016 1447 03/10/2016 1643  PHART 7.032* 7.165* 7.229*  PCO2ART 52.3* 43.9 43.2  PO2ART 75.0* 74.0* 69.0*    Liver Enzymes  Recent Labs Lab 03/22/2016 2341 Apr 03, 2016 0600  AST 46* 8,997*  ALT 21 5,381*  ALKPHOS 81 74   BILITOT 0.7 1.1  ALBUMIN 3.4* 2.2*  2.2*    Cardiac Enzymes  Recent Labs Lab 04/03/2016 2341 03/05/2016 0520 03/21/2016 1212  TROPONINI 2.54* 10.57* >65.00*    Glucose  Recent Labs Lab Apr 03, 2016 0308 04-03-16 0408 2016/04/03 0513 April 03, 2016 0610 04-03-16 0720 April 03, 2016 0813  GLUCAP 116* 95 90 82 108* 123*    Imaging Dg Chest Port 1 View  Result Date: 2016/04/03 CLINICAL DATA:  Intubation. EXAM: PORTABLE CHEST 1 VIEW COMPARISON:  03/31/2016 .  01/11/2015 . FINDINGS: Endotracheal tube, NG tube, left subclavian line in stable position. Left subclavian line in stable position . Left pulmonary artery catheter noted with tip over the upper portion of the lower lobe pulmonary artery . Heart size normal. Progressive diffuse bilateral pulmonary infiltrates and/or edema. Small bilateral pleural effusions. No pneumothorax. IMPRESSION: 1. Left pulmonary artery catheter noted with tip over the upper portion of the left lower low pulmonary artery. Remaining lines and tubes in stable position. 2. Progressive bilateral pulmonary infiltrates/pulmonary edema. Persistent bilateral pleural effusions, unchanged. Electronically Signed   By: Maisie Fus  Register   On: 2016-04-03 07:04    STUDIES:  CXR 7/27 > mild pulm vascular congestion with possible PNA. Echo 7/27 > 10-15% EF LE duplex 7/27 >  CULTURES: Blood 7/27 > Urine 7/27 > Sputum 7/27 > MRSA (-)  ANTIBIOTICS: Azithro 7/27 >>>  Ceftriaxone 7/27 >>> 7/27 ceftaz 7/27>>> vanc 7/27>>>  SIGNIFICANT EVENTS: 7/27 > admitted, acidosis, shock, LBBB, trop up, LA up  LINES/TUBES: ETT 7/27 >>> L [place] CVL 7/27 >>>  DISCUSSION: 75 y.o. M with hx COPD by report, admitted 7/27 with acute respiratory distress requiring intubation in ED.  Concern for PE given sudden onset of symptoms and clinical picture but unable to CTA chest due to AKI.  In addition, has troponin bump along with new LBBB.  Cardiology has been called by EDP. LHC with severe 3VD, not  amenable to surgery. Pt is in cardiogenic shock.   ASSESSMENT / PLAN:  PULMONARY A: Acute hypoxic respiratory failure 2/2 Acute MI and cardiogenic shock and pulmonary edema. Concern for PNA but suspicion is low.   P:   Over all prognosis is poor.  Cards and Surgery and Renal agree with poor prognosis over all. Not a candidate for CABG.  I extensively d/w pt's son and daughter in law regarding over all poor prognosis. They understand.  They want to speak with pt's wife (she has some memory issues) and decide with goals of care.  I recommended  comfort care and son seems to be receptive to withdrawing support once his mother is OK with the plan.  Cont vent support. No weaning. Cont pressors.  Will d/c steroids.   CARDIOVASCULAR A:  Cardiogenic shock 2/2 acute MI. Pulmonary edema.  Severe 3VD Hx HTN, HLD P:  As above. Cont vent support. Cards following. To start dobutamine drip. Cont pressors (levophed and vasopressin). Cont CVVH.  Continue heparin gtt Continue outpatient ASA, pravastatin.  RENAL A:   AKI 2/2 hypoperfusion/cardiogenic shock Hyponatremia, hyperkalemia P:   Cont CVVH Renal following. I discussed case with Dr. Darrick Penna.  Cont HCO3 drip for now   GASTROINTESTINAL A:   GI prophylaxis. Nutrition. P:   SUP: Pantoprazole. NPO  HEMATOLOGIC A:   VTE Prophylaxis. P:  SCD's / heparin gtt.   INFECTIOUS A:   Possible CAP P:   Abx as above (azithromycin / ceftaz/ vanc) > deescalate once with cultures.  Follow cultures as above. PCT elevated  ENDOCRINE A:   Hyperglycemia - no hx DM.   P:   SSI.    NEUROLOGIC A:   Acute encephalopathy. P:   Sedation:  Fentanyl gtt for now > He looks comfortable RASS goal: 0 to -1. Hold outpatient citalopram.  Critical care time with this patient today : 35 minutes.   Over all prognosis is poor.  Cards and Surgery and Renal agree with poor prognosis over all. Not a candidate for CABG.  I extensively d/w pt's son  and daughter in law regarding over all poor prognosis. They understand.  They want to speak with pt's wife (she has some memory issues) and decide with goals of care.  I recommended comfort care and son seems to be receptive to withdrawing support once his mother is OK with the plan.   Anticipate family will withdraw care this am once son has spoken to his mother.   Pollie Meyer, MD 03/26/2016, 9:16 AM [facility] Pager [phone] After 3 pm or if no answer, call [phone]

## 2016-04-05 NOTE — Progress Notes (Signed)
Patient ID: Thomas Mendoza, male   DOB: 08-May-1941, 75 y.o.   MRN: [id number]    SUBJECTIVE: Remains intubated/sedated.  Low grade fever to 100.4.    7/27 had coronary angiography showing severe complex 99% ostial LM and 99% ostial LCx and 99% ostial LAD.  He had severe PAD involving the right and left iliofemoral systems which precluded placement of Impella or IABP.  He was seen by surgery who thought he was too high risk for CABG.    LHC/RHC (7/27) Severe complex 99% ostial LM and 99% ostial LCx and 99% ostial LAD.  RCA ok RA mean 12 PA 48/32 Mean PCWP 32 CI 1.6  [place]wan number this morning: RA 10 PCWP mean 28 CI 1.5  He remains on norepinephrine at 38 and vasopressin at 0.03, heparin gtt ongoing.  He is anuric.   Scheduled Meds: . antiseptic oral rinse  7 mL Mouth Rinse 10 times per day  . aspirin  81 mg Per Tube Daily  . azithromycin  500 mg Intravenous QHS  . cefTAZidime (FORTAZ)  IV  2 g Intravenous Q12H  . chlorhexidine gluconate (SAGE KIT)  15 mL Mouth Rinse BID  . famotidine (PEPCID) IV  20 mg Intravenous Q24H  . insulin regular  0-10 Units Intravenous TID WC  . ipratropium-albuterol  3 mL Nebulization Q6H  . methylPREDNISolone (SOLU-MEDROL) injection  80 mg Intravenous Q8H  . pravastatin  20 mg Per Tube q1800  . sodium chloride flush  3 mL Intravenous Q12H  . sodium chloride flush  3 mL Intravenous Q12H  . sodium chloride flush  3 mL Intravenous Q12H  . sodium chloride flush  3 mL Intravenous Q12H  . vancomycin  500 mg Intravenous Q24H   Continuous Infusions: . sodium chloride 10 mL/hr at 2016/04/15 0000  . dextrose 5 % and 0.45% NaCl 100 mL/hr at Apr 15, 2016 0631  . DOBUTamine    . fentaNYL infusion INTRAVENOUS 250 mcg/hr (04-15-16 0506)  . heparin 10,000 units/ 20 mL infusion syringe    . heparin 900 Units/hr (2016/04/15 0113)  . insulin (NOVOLIN-R) infusion 0.5 Units/hr (April 15, 2016 0610)  . norepinephrine (LEVOPHED) Adult infusion 39 mcg/min (April 15, 2016 0701)  . dialysis  replacement fluid (prismasate)    . dialysis replacement fluid (prismasate)    . dialysate (PRISMASATE)    .  sodium bicarbonate  infusion 1000 mL 125 mL/hr at April 15, 2016 0559  . vasopressin (PITRESSIN) infusion - *FOR SHOCK* 0.03 Units/min (April 15, 2016 0600)   PRN Meds:.sodium chloride, sodium chloride, sodium chloride, sodium chloride, acetaminophen, dextrose, docusate, fentaNYL, levalbuterol, ondansetron (ZOFRAN) IV, sodium chloride, sodium chloride flush, sodium chloride flush, sodium chloride flush, sodium chloride flush    Vitals:   April 15, 2016 0515 04-15-2016 0530 04/15/2016 0545 2016/04/15 0600  BP: (!) 83/60 (!) 86/63 (!) 83/60 (!) 87/66  Pulse: (!) 117 (!) 117 (!) 117 (!) 116  Resp: (!) 30 (!) 30 (!) 27 (!) 30  Temp: (!) 100.4 F (38 C) (!) 100.4 F (38 C) (!) 100.4 F (38 C) (!) 100.4 F (38 C)  TempSrc:      SpO2: 95% 95% 95% 96%  Weight:      Height:        Intake/Output Summary (Last 24 hours) at 04-15-16 0659 Last data filed at 04-15-2016 0600  Gross per 24 hour  Intake          5536.85 ml  Output               25 ml  Net  5511.85 ml    LABS: Basic Metabolic Panel:  Recent Labs  03/05/2016 2341  03/09/2016 2105 04-08-2016 0120  NA 132*  < > 133* 134*  K 4.6  < > 4.5 4.8  CL 101  < > 102 99*  CO2 16*  < > 22 21*  GLUCOSE 337*  < > 155* 143*  BUN 23*  < > 33* 36*  CREATININE 1.72*  < > 3.10* 3.56*  CALCIUM 9.4  < > 7.2* 7.0*  MG 2.6*  --   --   --   PHOS 7.0*  --   --   --   < > = values in this interval not displayed. Liver Function Tests:  Recent Labs  03/22/2016 2341  AST 46*  ALT 21  ALKPHOS 81  BILITOT 0.7  PROT 6.8  ALBUMIN 3.4*   No results for input(s): LIPASE, AMYLASE in the last 72 hours. CBC:  Recent Labs  03/25/2016 2341 03/27/2016 0520  WBC 13.2* 15.0*  HGB 12.5* 11.6*  HCT 37.8* 33.7*  MCV 109.2* 108.4*  PLT 327 551*   Cardiac Enzymes:  Recent Labs  03/18/2016 2341 03/14/2016 0520 03/13/2016 1212  TROPONINI 2.54* 10.57* >65.00*    BNP: Invalid input(s): POCBNP D-Dimer: No results for input(s): DDIMER in the last 72 hours. Hemoglobin A1C:  Recent Labs  04/02/2016 0216  HGBA1C 5.5   Fasting Lipid Panel: No results for input(s): CHOL, HDL, LDLCALC, TRIG, CHOLHDL, LDLDIRECT in the last 72 hours. Thyroid Function Tests: No results for input(s): TSH, T4TOTAL, T3FREE, THYROIDAB in the last 72 hours.  Invalid input(s): FREET3 Anemia Panel: No results for input(s): VITAMINB12, FOLATE, FERRITIN, TIBC, IRON, RETICCTPCT in the last 72 hours.  RADIOLOGY: Dg Chest Portable 1 View  Result Date: 03/25/2016 CLINICAL DATA:  75 year old male with central line placement. EXAM: PORTABLE CHEST 1 VIEW COMPARISON:  Chest radiograph dated 03/06/2016 FINDINGS: There has been interval placement of a left subclavian central line with tip over central SVC. The endotracheal and enteric tubes are in stable positioning. Emphysematous changes of the lungs with bilateral patchy ground-glass densities similar to prior study. The cardiac silhouette is within normal limits. No acute osseous pathology. IMPRESSION: Interval placement of a left subclavian central venous catheter with tip over central SVC. No pneumothorax. Other support devices in stable positioning. Emphysema with bilateral patchy airspace densities as seen on the prior study. Follow-up recommended. Electronically Signed   By: Anner Crete M.D.   On: 03/24/2016 02:00  Dg Chest Portable 1 View  Result Date: 03/17/2016 CLINICAL DATA:  Intubation EXAM: PORTABLE CHEST 1 VIEW COMPARISON:  04/04/2016; 01/11/2015; 05/29/2014 FINDINGS: Examination is degraded secondary to exclusion of the bilateral costophrenic angles. The endotracheal tube overlies the tracheal air column with tip approximately 2.4 cm above the carina. Enteric tube tip and side port project below the midline of the lower thorax. No definite pneumothorax. Grossly unchanged cardiac silhouette and mediastinal contours.  Atherosclerotic plaque within thoracic aorta. The pulmonary vasculature is indistinct with cephalization of flow. Worsening perihilar heterogeneous airspace opacities. No definite pleural effusion, though note, the bilateral costophrenic angles are excluded from view. Unchanged bones. IMPRESSION: 1. Endotracheal tube tip overlies the tracheal air column, approximately 2.4 cm above the carina. No pneumothorax. 2. Enteric tube projects below the midline of the lower thorax, tip excluded from view. 3. Similar findings of suspected pulmonary edema with worsening perihilar opacities with differential considerations include atelectasis, worsening alveolar pulmonary edema and aspiration. 4.  Aortic Atherosclerosis (ICD10-170.0) Electronically Signed  By: Sandi Mariscal M.D.   On: 04/03/2016 00:40  Dg Chest Portable 1 View  Result Date: 03/26/2016 CLINICAL DATA:  75 year old male with shortness of breath EXAM: PORTABLE CHEST 1 VIEW COMPARISON:  Chest radiograph dated 01/11/2015 FINDINGS: Single portable view of the chest demonstrates emphysematous changes of the lungs with interstitial coarsening, progressed compared to prior study. Areas of hazy density in the perihilar region with Kerley B-lines suggest mild congestion and edema. Superimposed pneumonia is not excluded. Clinical correlation is recommended. There is no focal consolidation. No pneumothorax. The cardiac silhouette is within normal limits with no acute osseous pathology. IMPRESSION: Mild pulmonary vascular congestion and interstitial edema on the background of emphysema. Pneumonia is not excluded. Clinical correlation is recommended. Electronically Signed   By: Anner Crete M.D.   On: 04/02/2016 00:11   PHYSICAL EXAM General: Intubated/sedated.  Neck: JVP 10 cm, no thyromegaly or thyroid nodule.  Lungs: Coarse breath sounds bilaterally. CV: Nondisplaced PMI.  Heart tachy, regular S1/S2, no S3/S4, no murmur.  Trace ankle edema.   Abdomen: Soft,  nontender, no hepatosplenomegaly, no distention.  Neurologic: Sedated Extremities: No clubbing or cyanosis.   TELEMETRY: Reviewed telemetry pt in sinus tachy in 110s  ASSESSMENT AND PLAN: 75 yo with history of COPD.  He developed dyspnea at home 7/26.  He came to the ER with respiratory arrest, hypoxemia and developed hypotension.  DDx initially AECOPD versus PNA vs PE vs MI.  Initial troponin 2, rose to >65 through the day.   1. Acute MI: LBBB on ECG, suspect new, not on last ECG in 2015.  Troponin up to > 65.  Echo done, EF 10%.  Unsure if first event was respiratory-related (acute exacerbation of COPD/PNA) followed by MI or was all related to MI.  Currently with cardiogenic shock.  Cath on 7/27 showed severe complex 99% ostial LM/LAD/LCX disease.  There was not an interventional option as he had extensive PAD precluding placement of Impella or IABP => given severity of disease, could not attempt intervention without circulatory support.  He was seen by cardiac surgery and deemed to unstable for CABG in the OR.  - At this point, continue ASA and statin, but his prognosis is grim given our inability to intervene.  2. Shock: Cardiogenic, EF 10% on echo.  He is currently on vasopressin and norepinephrine with SBP in upper 80s.  CI is 1.5 this morning. Sinus tachycardia likely due to low output.  - Unable to place mechanical support as above due to extensive PAD.  - Add dobutamine 3 mcg/kg/min.  3. AKI: Anuria in setting of shock and coronary angiography.  Will need palliative care discussion.  If we continue to treat aggressively, would need CVVH. 4. ID: WBCs elevated, covering with broad spectrum antibiotics for possible PNA.  Low grade fever to 100.4 today.  Blood cultures sent.  5. COPD: ?component of COPD exacerbation.  6. I think that at this point palliative care would be the most appropriate route.  No family present this morning.   Loralie Champagne 04/04/16 7:10 AM

## 2016-04-05 NOTE — Care Management Important Message (Signed)
Important Message  Patient Details  Name: Thomas Mendoza MRN: [id number] Date of Birth: 17-Jun-1941   Medicare Important Message Given:  Yes    Bernadette Hoit 03/14/2016, 10:29 AM

## 2016-04-05 NOTE — Progress Notes (Signed)
Time of Death 1318.  Pt rhythm asystole.  No heart or breath sounds.  Confirmed with Edd Fabian, RN.  Family at bedside and made aware.  Dr. Christene Slates notified.  Wimer Donar notifed.

## 2016-04-05 NOTE — Discharge Summary (Signed)
PULMONARY / CRITICAL CARE MEDICINE   Name: Thomas Mendoza MRN: [id number] DOB: 03-23-1941    ADMISSION DATE:  03/12/2016 CONSULTATION DATE:  03/14/2016  REFERRING MD:  Horton - EDP  CHIEF COMPLAINT:  SOB  HISTORY OF PRESENT ILLNESS:  Pt is encephelopathic; therefore, this HPI is obtained from chart review. Thomas Mendoza is a 75 y.o. male with PMH as outlined below including COPD (Mendoza PFT's in system).  He was in his usual state of health up until evening of 7/26 just before he and his wife were getting ready to go to bed.  Wife states as she was getting changed, he called out for her and complained of SOB.  Symptoms worsened to the point that she had to call EMS.  He had very mild SOB but per wife, symptoms suddenly worsened as he called out for her.  Pt tried to use his inhaler while EMS were on the way but got Mendoza relief.  On EMS arrival, he was place don CPAP and was given IM epi along with solumedrol and BD's.  On ED arrival, he remained hypoxic but was also tachycardic and hypotensive.  Due to respiratory distress, he required intubation in ED.  In addition, he was hypothermic, had elevated lactate (8.37), elevated troponin (2.54) and new LBBB.  Due to AKI, he was unable to get CTA of the chest.  Given his sudden onset of symptoms, he was started on empiric heparin for possible PE.  Due to troponin bump along with new LBBB, cardiology was also called by EDP.   SUBJECTIVE: horrible acidosis worsening, levophed up  VITAL SIGNS: BP 93/73   Pulse (!) 119   Temp 100.2 F (37.9 C)   Resp (!) 30   Ht 5\' 9"  (1.753 m)   Wt 69.4 kg (153 lb)   SpO2 (!) 74%   BMI 22.59 kg/m   HEMODYNAMICS: PAP: (37-55)/(25-36) 55/33 CVP:  [8 mmHg-16 mmHg] 16 mmHg PCWP:  [28 mmHg] 28 mmHg CO:  [2.7 L/min] 2.7 L/min CI:  [1.5 L/min/m2] 1.5 L/min/m2  VENTILATOR SETTINGS: Vent Mode: PRVC FiO2 (%):  [40 %-60 %] 60 % Set Rate:  [30 bmp] 30 bmp Vt Set:  [560 mL-630 mL] 560 mL PEEP:  [5 cmH20] 5  cmH20 Plateau Pressure:  [20 cmH20-27 cmH20] 27 cmH20  INTAKE / OUTPUT: I/O last 3 completed shifts: In: 10506.6 [I.V.:9466.6; NG/GT:90; IV Piggyback:950] Out: 25 [Urine:25]   PHYSICAL EXAMINATION: General: Chronically ill appearing male, vent Neuro: Sedated, non-responsive., rass -3 HEENT: [place]/AT. PERRL, sclerae anicteric Cardiovascular: s1 s2 RRT, Mendoza M/R/G  Lungs: wheezing mod diffuse, some low air entry Abdomen: BS x 4, soft, Mendoza r/g Musculoskeletal: Mendoza gross deformities, Mendoza edema.  Skin: Intact, warm, Mendoza rashes. Gen; hernia rt not warm or red  LABS:  BMET  Recent Labs Lab 03/26/2016 2105 Apr 29, 2016 0120 2016-04-29 0600  NA 133* 134* 134*  134*  K 4.5 4.8 5.2*  5.2*  CL 102 99* 95*  95*  CO2 22 21* 23  23  BUN 33* 36* 38*  38*  CREATININE 3.10* 3.56* 3.83*  3.74*  GLUCOSE 155* 143* 80  79    Electrolytes  Recent Labs Lab 04/02/2016 2341  03/13/2016 2105 04-29-2016 0120 2016/04/29 0600  CALCIUM 9.4  < > 7.2* 7.0* 6.9*  6.9*  MG 2.6*  --   --   --  2.5*  PHOS 7.0*  --   --   --  8.4*  8.4*  < > = values in  this interval not displayed.  CBC  Recent Labs Lab 03/17/2016 2341 03/29/2016 0520 2016-04-13 0600  WBC 13.2* 15.0* 14.3*  HGB 12.5* 11.6* 12.0*  HCT 37.8* 33.7* 37.1*  PLT 327 551* 169    Coag's  Recent Labs Lab 2016-04-13 0600  APTT 48*    Sepsis Markers  Recent Labs Lab 03/28/2016 0216 03/09/2016 0226 03/05/2016 0520 03/16/2016 0800 Apr 13, 2016 0600  LATICACIDVEN 4.9* 4.89* 6.5*  --   --   PROCALCITON 0.10  --   --  2.96 20.60    ABG  Recent Labs Lab 03/07/2016 0846 03/26/2016 1447 03/29/2016 1643  PHART 7.032* 7.165* 7.229*  PCO2ART 52.3* 43.9 43.2  PO2ART 75.0* 74.0* 69.0*    Liver Enzymes  Recent Labs Lab 04/02/2016 2341 04/13/2016 0600  AST 46* 8,997*  ALT 21 5,381*  ALKPHOS 81 74  BILITOT 0.7 1.1  ALBUMIN 3.4* 2.2*  2.2*    Cardiac Enzymes  Recent Labs Lab 03/23/2016 2341 03/28/2016 0520 03/27/2016 1212  TROPONINI 2.54* 10.57*  >65.00*    Glucose  Recent Labs Lab April 13, 2016 0408 04/13/2016 0513 April 13, 2016 0610 04-13-16 0720 April 13, 2016 0813 04-13-2016 1144  GLUCAP 95 90 82 108* 123* 172*    Imaging Dg Chest Port 1 View  Result Date: 04/13/2016 CLINICAL DATA:  Intubation. EXAM: PORTABLE CHEST 1 VIEW COMPARISON:  04/04/2016 .  01/11/2015 . FINDINGS: Endotracheal tube, NG tube, left subclavian line in stable position. Left subclavian line in stable position . Left pulmonary artery catheter noted with tip over the upper portion of the lower lobe pulmonary artery . Heart size normal. Progressive diffuse bilateral pulmonary infiltrates and/or edema. Small bilateral pleural effusions. Mendoza pneumothorax. IMPRESSION: 1. Left pulmonary artery catheter noted with tip over the upper portion of the left lower low pulmonary artery. Remaining lines and tubes in stable position. 2. Progressive bilateral pulmonary infiltrates/pulmonary edema. Persistent bilateral pleural effusions, unchanged. Electronically Signed   By: Maisie Fus  Register   On: 2016-04-13 07:04    STUDIES:  CXR 7/27 > mild pulm vascular congestion with possible PNA. Echo 7/27 > LE duplex 7/27 >  CULTURES: Blood 7/27 > Urine 7/27 > Sputum 7/27 >  ANTIBIOTICS: Azithro 7/27 >>> Ceftriaxone 7/27 >>> ceftaz 7/27>>> vanc 7/27>>>  SIGNIFICANT EVENTS: 7/27 > admitted, acidosis, shock, LBBB, trop up, LA up  LINES/TUBES: ETT 7/27 >>> L [place] CVL 7/27 >>>  DISCUSSION: 75 y.o. M with hx COPD by report, admitted 7/27 with acute respiratory distress requiring intubation in ED.  Concern for PE given sudden onset of symptoms and clinical picture but unable to CTA chest due to AKI.  In addition, has troponin bump along with new LBBB.  Cardiology has been called by EDP.  ASSESSMENT / PLAN:  PULMONARY A: Acute hypoxic respiratory failure. Unlikely PE with pcxr findings and LBBB pulmonary edema - r/o ischemia Possible CAP=, low suspcion Tobacco dependence, COPD P:    ABg reviewed, increase T V for now, rate is maxed to 30, repeat abg in 30 min  Place a line likely can reduce fio2 to goal 50% abg in am  Even balance goals cvp assessment 13 noted solumedrol increase Bders Doppler, echo- see cvs  CARDIOVASCULAR A:  Shock - unclear etiology.  Concern for cardiogenic from ACS vs obstructive from PE (favor less) ACS / NSTEMI - EKG with new LBBB. Hx HTN, HLD P:  Continue levophed as needed for goal MAP > 65. Continue heparin gtt troponins, lactat Assess echo STAT Cardiology consulted, Mendoza note, call back now Continue outpatient ASA,  pravastatin. Hold outpatient lisinopril. Might need vasopressin Place aline Continued steroids  RENAL A:   Hyponatremia, hyperkalemia AKI AGMA - lactate, larger NONAG (type b lactic) Inaccurate bladder scan? P:   NS avoid Correct electrolytes as indicated. BMP in AM. Increase biacarb bmet q4h replace foley but Bladder shows 250 - Seriel Bladder scans in 4 hours  kayxlate  X 1 May need renal consult  GASTROINTESTINAL A:   GI prophylaxis. Nutrition. P:   SUP: Pantoprazole. NPO Repeat lft in am  Hold feeds  HEMATOLOGIC A:   VTE Prophylaxis. P:  SCD's / heparin gtt. CBC in AM.  INFECTIOUS A:   Possible CAP ( do not favor), copd at risk pseudo P:   Abx as above (azithromycin / ceftriaxone), change to vanc ceftaz, keep azithro Follow cultures as above. Assess PCT trend Assess leg, strep ag  ENDOCRINE A:   Hyperglycemia - Mendoza hx DM.  Exacerbated by steroids. P:   SSI. steroids  NEUROLOGIC A:   Acute encephalopathy. P:   Sedation:  Fentanyl gtt, increase change rass -3 Dc Midazolam PRN. RASS goal: 0 to -1. Daily WUA. Hold outpatient citalopram.  Family updated: Wife updated at bedside.  Interdisciplinary Family Meeting v Palliative Care Meeting:  Due by: 04/07/16.  Ccm time 60 min   Mcarthur Rossetti. [name], MD, FACP Pgr: [phone] [place] [facility]  Patient  had a very complicated case.  Pls see my note below:   PULMONARY / CRITICAL CARE MEDICINE   Name: Thomas Mendoza MRN: [id number] DOB: June 06, 1941    ADMISSION DATE:  03/18/2016 CONSULTATION DATE:  03/09/2016  REFERRING MD:  Horton - EDP  CHIEF COMPLAINT:  SOB  HISTORY OF PRESENT ILLNESS:  Pt is encephelopathic; therefore, this HPI is obtained from chart review. Thomas Mendoza is a 74 y.o. male with PMH as outlined below including COPD (Mendoza PFT's in system).  He was in his usual state of health up until evening of 7/26 just before he and his wife were getting ready to go to bed.  Wife states as she was getting changed, he called out for her and complained of SOB.  Symptoms worsened to the point that she had to call EMS.  He had very mild SOB but per wife, symptoms suddenly worsened as he called out for her.  Pt tried to use his inhaler while EMS were on the way but got Mendoza relief.  On EMS arrival, he was place don CPAP and was given IM epi along with solumedrol and BD's.  On ED arrival, he remained hypoxic but was also tachycardic and hypotensive.  Due to respiratory distress, he required intubation in ED.  In addition, he was hypothermic, had elevated lactate (8.37), elevated troponin (2.54) and new LBBB.  Due to AKI, he was unable to get CTA of the chest.  Given his sudden onset of symptoms, he was started on empiric heparin for possible PE.  Due to troponin bump along with new LBBB, cardiology was also called by EDP.   SUBJECTIVE:  Clinically worsened overnight with hypotension. On levophed, vasopressin.  On CVVH. Had LHC with severe 3VD and inoperable per TCVS.    VITAL SIGNS: BP 93/73   Pulse (!) 119   Temp 100.2 F (37.9 C)   Resp (!) 30   Ht 5\' 9"  (1.753 m)   Wt 69.4 kg (153 lb)   SpO2 (!) 74%   BMI 22.59 kg/m   HEMODYNAMICS: PAP: (37-55)/(25-36) 55/33 CVP:  [8 mmHg-16 mmHg]  16 mmHg PCWP:  [28 mmHg] 28 mmHg CO:  [2.7 L/min] 2.7 L/min CI:  [1.5 L/min/m2] 1.5  L/min/m2  VENTILATOR SETTINGS: Vent Mode: PRVC FiO2 (%):  [40 %-60 %] 60 % Set Rate:  [30 bmp] 30 bmp Vt Set:  [560 mL-630 mL] 560 mL PEEP:  [5 cmH20] 5 cmH20 Plateau Pressure:  [20 cmH20-27 cmH20] 27 cmH20  INTAKE / OUTPUT: I/O last 3 completed shifts: In: 10506.6 [I.V.:9466.6; NG/GT:90; IV Piggyback:950] Out: 25 [Urine:25]   PHYSICAL EXAMINATION: General: Chronically ill appearing male, vent. Sedated. Follows simple commands on and off Neuro: Sedated, CN grossly intact. (-) lateralizing signs noted.  HEENT: [place]/AT. PERRL, sclerae anicteric Cardiovascular: soft s1 s2 RRT, Mendoza M/R/G  Lungs: fair ae. Wheeze at the bases. Crackles at bases.  Abdomen: dec BS, soft, Mendoza r/g Musculoskeletal: Mendoza gross deformities, Mendoza edema.  Skin: Intact, warm, Mendoza rashes. Gen; hernia rt. Cool extremities distally.   LABS:  BMET  Recent Labs Lab 03/23/2016 2105 04-23-2016 0120 Apr 23, 2016 0600  NA 133* 134* 134*  134*  K 4.5 4.8 5.2*  5.2*  CL 102 99* 95*  95*  CO2 22 21* 23  23  BUN 33* 36* 38*  38*  CREATININE 3.10* 3.56* 3.83*  3.74*  GLUCOSE 155* 143* 80  79    Electrolytes  Recent Labs Lab 03/29/2016 2341  03/15/2016 2105 Apr 23, 2016 0120 23-Apr-2016 0600  CALCIUM 9.4  < > 7.2* 7.0* 6.9*  6.9*  MG 2.6*  --   --   --  2.5*  PHOS 7.0*  --   --   --  8.4*  8.4*  < > = values in this interval not displayed.  CBC  Recent Labs Lab 03/13/2016 2341 03/30/2016 0520 04-23-2016 0600  WBC 13.2* 15.0* 14.3*  HGB 12.5* 11.6* 12.0*  HCT 37.8* 33.7* 37.1*  PLT 327 551* 169    Coag's  Recent Labs Lab 04/23/16 0600  APTT 48*    Sepsis Markers  Recent Labs Lab 03/05/2016 0216 03/12/2016 0226 03/18/2016 0520 03/10/2016 0800 Apr 23, 2016 0600  LATICACIDVEN 4.9* 4.89* 6.5*  --   --   PROCALCITON 0.10  --   --  2.96 20.60    ABG  Recent Labs Lab 03/24/2016 0846 03/29/2016 1447 03/19/2016 1643  PHART 7.032* 7.165* 7.229*  PCO2ART 52.3* 43.9 43.2  PO2ART 75.0* 74.0* 69.0*    Liver  Enzymes  Recent Labs Lab 04/03/2016 2341 04-23-16 0600  AST 46* 8,997*  ALT 21 5,381*  ALKPHOS 81 74  BILITOT 0.7 1.1  ALBUMIN 3.4* 2.2*  2.2*    Cardiac Enzymes  Recent Labs Lab 03/26/2016 2341 03/21/2016 0520 04/03/2016 1212  TROPONINI 2.54* 10.57* >65.00*    Glucose  Recent Labs Lab 04-23-16 0408 04/23/2016 0513 04/23/2016 0610 04-23-2016 0720 23-Apr-2016 0813 04/23/16 1144  GLUCAP 95 90 82 108* 123* 172*    Imaging Dg Chest Port 1 View  Result Date: 04/23/2016 CLINICAL DATA:  Intubation. EXAM: PORTABLE CHEST 1 VIEW COMPARISON:  03/23/2016 .  01/11/2015 . FINDINGS: Endotracheal tube, NG tube, left subclavian line in stable position. Left subclavian line in stable position . Left pulmonary artery catheter noted with tip over the upper portion of the lower lobe pulmonary artery . Heart size normal. Progressive diffuse bilateral pulmonary infiltrates and/or edema. Small bilateral pleural effusions. Mendoza pneumothorax. IMPRESSION: 1. Left pulmonary artery catheter noted with tip over the upper portion of the left lower low pulmonary artery. Remaining lines and tubes in stable position. 2. Progressive bilateral pulmonary infiltrates/pulmonary edema.  Persistent bilateral pleural effusions, unchanged. Electronically Signed   By: Maisie Fus  Register   On: 2016-04-26 07:04    STUDIES:  CXR 7/27 > mild pulm vascular congestion with possible PNA. Echo 7/27 > 10-15% EF LE duplex 7/27 >  CULTURES: Blood 7/27 > Urine 7/27 > Sputum 7/27 > MRSA (-)  ANTIBIOTICS: Azithro 7/27 >>>  Ceftriaxone 7/27 >>> 7/27 ceftaz 7/27>>> vanc 7/27>>>  SIGNIFICANT EVENTS: 7/27 > admitted, acidosis, shock, LBBB, trop up, LA up  LINES/TUBES: ETT 7/27 >>> L [place] CVL 7/27 >>>  DISCUSSION: 75 y.o. M with hx COPD by report, admitted 7/27 with acute respiratory distress requiring intubation in ED.  Concern for PE given sudden onset of symptoms and clinical picture but unable to CTA chest due to AKI.  In  addition, has troponin bump along with new LBBB.  Cardiology has been called by EDP. LHC with severe 3VD, not amenable to surgery. Pt is in cardiogenic shock.   ASSESSMENT / PLAN:  PULMONARY A: Acute hypoxic respiratory failure 2/2 Acute MI and cardiogenic shock and pulmonary edema. Concern for PNA but suspicion is low.   P:   Over all prognosis is poor.  Cards and Surgery and Renal agree with poor prognosis over all. Not a candidate for CABG.  I extensively d/w pt's son and daughter in law regarding over all poor prognosis. They understand.  They want to speak with pt's wife (she has some memory issues) and decide with goals of care.  I recommended comfort care and son seems to be receptive to withdrawing support once his mother is OK with the plan.  Cont vent support. Mendoza weaning. Cont pressors.  Will d/c steroids.   CARDIOVASCULAR A:  Cardiogenic shock 2/2 acute MI. Pulmonary edema.  Severe 3VD Hx HTN, HLD P:  As above. Cont vent support. Cards following. To start dobutamine drip. Cont pressors (levophed and vasopressin). Cont CVVH.  Continue heparin gtt Continue outpatient ASA, pravastatin.  RENAL A:   AKI 2/2 hypoperfusion/cardiogenic shock Hyponatremia, hyperkalemia P:   Cont CVVH Renal following. I discussed case with Dr. Darrick Penna.  Cont HCO3 drip for now   GASTROINTESTINAL A:   GI prophylaxis. Nutrition. P:   SUP: Pantoprazole. NPO  HEMATOLOGIC A:   VTE Prophylaxis. P:  SCD's / heparin gtt.   INFECTIOUS A:   Possible CAP P:   Abx as above (azithromycin / ceftaz/ vanc) > deescalate once with cultures.  Follow cultures as above. PCT elevated  ENDOCRINE A:   Hyperglycemia - Mendoza hx DM.   P:   SSI.    NEUROLOGIC A:   Acute encephalopathy. P:   Sedation:  Fentanyl gtt for now > He looks comfortable RASS goal: 0 to -1. Hold outpatient citalopram.  Critical care time with this patient today : 35 minutes.   Over all prognosis is poor.  Cards  and Surgery and Renal agree with poor prognosis over all. Not a candidate for CABG.  I extensively d/w pt's son and daughter in law regarding over all poor prognosis. They understand.  They want to speak with pt's wife (she has some memory issues) and decide with goals of care.  I recommended comfort care and son seems to be receptive to withdrawing support once his mother is OK with the plan.   Anticipate family will withdraw care this am once son has spoken to his mother.   Pollie Meyer, MD 04-26-16, 2:15 PM [facility] Pager [phone] After 3  pm or if Mendoza answer, call [phone]  Addendum:  Family decided to withdraw support and make pt comfort care. We discontinued all meds and vent.  Pt was pronounced dead at 13:18. Family at bedside and aware.    Pollie Meyer, MD 04/04/2016, 2:17 PM [facility] Pager [phone] After 3 pm or if Mendoza answer, call [phone]

## 2016-04-05 NOTE — Progress Notes (Signed)
   [facility] PCCM  All family has seen pt. Family wants to withdraw care.  Pt is now comfort care. Plan d/w nurse.    Pollie Meyer, MD 03/14/2016, 12:01 PM [facility] Pager [phone] After 3 pm or if no answer, call [phone]

## 2016-04-05 NOTE — Progress Notes (Signed)
ANTICOAGULATION CONSULT NOTE - Follow Up Consult  Pharmacy Consult for heparin Indication: chest pain/ACS, r/o PE  No Known Allergies  Patient Measurements: Height: 5' 9"  (175.3 cm) Weight: 153 lb (69.4 kg) IBW/kg (Calculated) : 70.7 Heparin Dosing Weight: 63 kg  Vital Signs: Temp: 100.2 F (37.9 C) (07/28 0839) Temp Source: Core (Comment) (07/28 0400) BP: 93/73 (07/28 0715) Pulse Rate: 119 (07/28 0839)  Labs:  Recent Labs  03/06/2016 2341 03/27/2016 0520  03/10/2016 1050 03/28/2016 1212  03/13/2016 2105 April 07, 2016 0120 07-Apr-2016 0600 04/07/16 0930  HGB 12.5* 11.6*  --   --   --   --   --   --  12.0*  --   HCT 37.8* 33.7*  --   --   --   --   --   --  37.1*  --   PLT 327 551*  --   --   --   --   --   --  169  --   APTT  --   --   --   --   --   --   --   --  48*  --   HEPARINUNFRC  --   --   --  0.21*  --   --   --   --   --  <0.10*  CREATININE 1.72* 1.96*  < >  --   --   < > 3.10* 3.56* 3.83*  3.74*  --   TROPONINI 2.54* 10.57*  --   --  >65.00*  --   --   --   --   --   < > = values in this interval not displayed.  Estimated Creatinine Clearance: 16.6 mL/min (by C-G formula based on SCr of 3.83 mg/dL).   Medications:  Scheduled:  . antiseptic oral rinse  7 mL Mouth Rinse 10 times per day  . aspirin  81 mg Per Tube Daily  . atorvastatin  80 mg Per Tube q1800  . azithromycin  500 mg Intravenous QHS  . cefTAZidime (FORTAZ)  IV  2 g Intravenous Q24H  . chlorhexidine gluconate (SAGE KIT)  15 mL Mouth Rinse BID  . famotidine (PEPCID) IV  20 mg Intravenous Q24H  . ipratropium-albuterol  3 mL Nebulization Q6H  . sodium chloride flush  3 mL Intravenous Q12H  . sodium chloride flush  3 mL Intravenous Q12H  . sodium chloride flush  3 mL Intravenous Q12H  . sodium chloride flush  3 mL Intravenous Q12H  . [START ON 04/02/2016] vancomycin  1,000 mg Intravenous Q48H   Infusions:  . sodium chloride 10 mL/hr at 2016/04/07 0000  . dextrose 5 % and 0.45% NaCl 100 mL/hr at 04/07/2016 0631   . DOBUTamine 3 mcg/kg/min (2016/04/07 0726)  . fentaNYL infusion INTRAVENOUS 250 mcg/hr (07-Apr-2016 0839)  . heparin 10,000 units/ 20 mL infusion syringe    . heparin 900 Units/hr (04-07-2016 0113)  . norepinephrine (LEVOPHED) Adult infusion 37 mcg/min (Apr 07, 2016 0943)  . dialysis replacement fluid (prismasate)    . dialysis replacement fluid (prismasate)    . dialysate (PRISMASATE)    .  sodium bicarbonate  infusion 1000 mL 125 mL/hr at 04/07/2016 0559  . vasopressin (PITRESSIN) infusion - *FOR SHOCK* 0.03 Units/min (Apr 07, 2016 0600)    Assessment: 75 yo male continuing on heparin gtt for ACS, r/o PE. Dopplers neg for DVT. Resumed s/p cath 7/27. HL now undectable on 900 units/h. Hg low stable, plt fluctuating but wnl, no bleed/IV line issues per RN.  May transition to comfort care soon.  Goal of Therapy:  Heparin level 0.3-0.7 units/ml Monitor platelets by anticoagulation protocol: Yes   Plan:  Increase heparin to 1150 units/h 8h HL, Daily HL/CBC Monitor s/sx bleeding   Elicia Lamp, PharmD, BCPS Clinical Pharmacist Pager [phone] 04-29-16 11:10 AM

## 2016-04-05 NOTE — Telephone Encounter (Signed)
On 04/05/16, I received a death certificate from [employer] & Janalyn Rouse (cremation). The death certificate was taken to e-link for Dr. Delphina Cahill A de Dios to sign. 04/05/16 ab. On April 16, 2016, I received the signed death certificate from Dr. Dorcas Mcmurray. I called the funeral home to let them know it was ready for pick up. 2016/04/16 ab

## 2016-04-05 NOTE — Progress Notes (Signed)
CBGs have been 6 consecutive below 180 and the rate has been <4units/hour. D51/2NS not started. Deterding, MD gave verbal orders to start D51/2NS and not Transition to Phase 3 for a few hours. Will initiate orders and communicate verbal order to AM RN.

## 2016-04-05 NOTE — Progress Notes (Signed)
Patient SOB on the vent, new onset wheezing, tachycardic, and blood pressures decreased. Deterding, MD cameraed in. Verbal orders to increase Fentanyl were given at this time. Weill increase Fentanyl and increase Levoophed for low pressures. RRT gave breathing treatment. Will continue to monitor patient.

## 2016-04-05 NOTE — Progress Notes (Signed)
40cc fentanyl and 40cc versed wasted in sink.  Witnessed by Paticia Stack, RN

## 2016-04-05 NOTE — Progress Notes (Signed)
   [facility] PCCM  Pt pronounced at 13:18 per RN.  Family at bedside. Pls provide post mortem care.   Pollie Meyer, MD 2016/04/19, 2:13 PM [facility] Pager [phone] After 3 pm or if no answer, call [phone]

## 2016-04-05 NOTE — Procedures (Signed)
Extubation Procedure Note  Patient Details:   Name: LARY YERGER DOB: November 07, 1940 MRN: [id number]   Airway Documentation:     Evaluation  O2 sats: transiently fell during during procedure Complications: Complications of desating dropping RR rate Patient did not tolerate procedure well. Bilateral Breath Sounds: Diminished   No   Terminal extubation  Extubated to room air  Nyree Yonker, Duane Lope 2016-04-30, 1:18 PM

## 2016-04-05 NOTE — Progress Notes (Signed)
Subjective: Interval History: on vent, sedated but awakens and obeys commands.  Objective: Vital signs in last 24 hours: Temp:  [98.1 F (36.7 C)-100.4 F (38 C)] 100.2 F (37.9 C) (07/28 0839) Pulse Rate:  [0-175] 119 (07/28 0839) Resp:  [24-59] 30 (07/28 0839) BP: (60-148)/(51-111) 93/73 (07/28 0715) SpO2:  [0 %-98 %] 97 % (07/28 0839) FiO2 (%):  [40 %-60 %] 60 % (07/28 0839) Weight:  [69.4 kg (153 lb)] 69.4 kg (153 lb) (07/28 0400) Weight change: 4.536 kg (10 lb)  Intake/Output from previous day: 07/27 0701 - 07/28 0700 In: 5689.7 [I.V.:4779.7; NG/GT:60; IV Piggyback:850] Out: 25 [Urine:25] Intake/Output this shift: No intake/output data recorded.  General appearance: cooperative, cachectic, slowed mentation and on vent Resp: diminished breath sounds bilaterally, rales bibasilar and rhonchi bibasilar Cardio: S1, S2 normal and systolic murmur: systolic ejection 2/6, decrescendo at 2nd left intercostal space GI: no bs, mod distension Extremities: bilat fem bruits  Lab Results:  Recent Labs  2016-04-19 0520 03/07/2016 0600  WBC 15.0* 14.3*  HGB 11.6* 12.0*  HCT 33.7* 37.1*  PLT 551* 169   BMET:  Recent Labs  03/17/2016 0120 03/06/2016 0600  NA 134* 134*  134*  K 4.8 5.2*  5.2*  CL 99* 95*  95*  CO2 21* 23  23  GLUCOSE 143* 80  79  BUN 36* 38*  38*  CREATININE 3.56* 3.83*  3.74*  CALCIUM 7.0* 6.9*  6.9*   No results for input(s): PTH in the last 72 hours. Iron Studies: No results for input(s): IRON, TIBC, TRANSFERRIN, FERRITIN in the last 72 hours.  Studies/Results: Dg Chest Port 1 View  Result Date: 03/05/2016 CLINICAL DATA:  Intubation. EXAM: PORTABLE CHEST 1 VIEW COMPARISON:  2016-04-19 .  01/11/2015 . FINDINGS: Endotracheal tube, NG tube, left subclavian line in stable position. Left subclavian line in stable position . Left pulmonary artery catheter noted with tip over the upper portion of the lower lobe pulmonary artery . Heart size normal.  Progressive diffuse bilateral pulmonary infiltrates and/or edema. Small bilateral pleural effusions. No pneumothorax. IMPRESSION: 1. Left pulmonary artery catheter noted with tip over the upper portion of the left lower low pulmonary artery. Remaining lines and tubes in stable position. 2. Progressive bilateral pulmonary infiltrates/pulmonary edema. Persistent bilateral pleural effusions, unchanged. Electronically Signed   By: Maisie Fus  Register   On: 03/18/2016 07:04  Dg Chest Portable 1 View  Result Date: 04-19-16 CLINICAL DATA:  75 year old male with central line placement. EXAM: PORTABLE CHEST 1 VIEW COMPARISON:  Chest radiograph dated 04-19-16 FINDINGS: There has been interval placement of a left subclavian central line with tip over central SVC. The endotracheal and enteric tubes are in stable positioning. Emphysematous changes of the lungs with bilateral patchy ground-glass densities similar to prior study. The cardiac silhouette is within normal limits. No acute osseous pathology. IMPRESSION: Interval placement of a left subclavian central venous catheter with tip over central SVC. No pneumothorax. Other support devices in stable positioning. Emphysema with bilateral patchy airspace densities as seen on the prior study. Follow-up recommended. Electronically Signed   By: Elgie Collard M.D.   On: 04-19-2016 02:00  Dg Chest Portable 1 View  Result Date: 04-19-16 CLINICAL DATA:  Intubation EXAM: PORTABLE CHEST 1 VIEW COMPARISON:  03/23/2016; 01/11/2015; 05/29/2014 FINDINGS: Examination is degraded secondary to exclusion of the bilateral costophrenic angles. The endotracheal tube overlies the tracheal air column with tip approximately 2.4 cm above the carina. Enteric tube tip and side port project below the midline of the  lower thorax. No definite pneumothorax. Grossly unchanged cardiac silhouette and mediastinal contours. Atherosclerotic plaque within thoracic aorta. The pulmonary vasculature is  indistinct with cephalization of flow. Worsening perihilar heterogeneous airspace opacities. No definite pleural effusion, though note, the bilateral costophrenic angles are excluded from view. Unchanged bones. IMPRESSION: 1. Endotracheal tube tip overlies the tracheal air column, approximately 2.4 cm above the carina. No pneumothorax. 2. Enteric tube projects below the midline of the lower thorax, tip excluded from view. 3. Similar findings of suspected pulmonary edema with worsening perihilar opacities with differential considerations include atelectasis, worsening alveolar pulmonary edema and aspiration. 4.  Aortic Atherosclerosis (ICD10-170.0) Electronically Signed   By: Simonne Come M.D.   On: 04/05/16 00:40  Dg Chest Portable 1 View  Result Date: April 05, 2016 CLINICAL DATA:  75 year old male with shortness of breath EXAM: PORTABLE CHEST 1 VIEW COMPARISON:  Chest radiograph dated 01/11/2015 FINDINGS: Single portable view of the chest demonstrates emphysematous changes of the lungs with interstitial coarsening, progressed compared to prior study. Areas of hazy density in the perihilar region with Kerley B-lines suggest mild congestion and edema. Superimposed pneumonia is not excluded. Clinical correlation is recommended. There is no focal consolidation. No pneumothorax. The cardiac silhouette is within normal limits with no acute osseous pathology. IMPRESSION: Mild pulmonary vascular congestion and interstitial edema on the background of emphysema. Pneumonia is not excluded. Clinical correlation is recommended. Electronically Signed   By: Elgie Collard M.D.   On: 04-05-2016 00:11   I have reviewed the patient's current medications.  Assessment/Plan: 1 [id number]  Secondary to cardiogenic schock and hypoperfusion.  multiple indic  fro RRT but need to temper with outlook and very poor prognosis 2 Cardiogenic schock 3 Severe CAD 4 PVD 5 Resp failur 6 COPD P Family discussions, hold off CRRT   LOS:  1 day   Timberlynn Kizziah L 03/20/2016,12:52 PM

## 2016-04-05 DEATH — deceased

## 2016-04-06 LAB — CULTURE, BLOOD (ROUTINE X 2)
CULTURE: NO GROWTH
Culture: NO GROWTH

## 2016-05-08 ENCOUNTER — Other Ambulatory Visit: Payer: Self-pay | Admitting: Family Medicine

## 2016-05-08 DIAGNOSIS — I10 Essential (primary) hypertension: Secondary | ICD-10-CM

## 2016-05-08 DIAGNOSIS — F329 Major depressive disorder, single episode, unspecified: Secondary | ICD-10-CM

## 2016-05-08 DIAGNOSIS — F32A Depression, unspecified: Secondary | ICD-10-CM

## 2016-05-08 DIAGNOSIS — E785 Hyperlipidemia, unspecified: Secondary | ICD-10-CM

## 2017-09-17 IMAGING — CR DG CHEST 1V PORT
2 series · 2 of 2 positions shown · non-contrast
Comparison: Chest radiograph dated 03/31/2016

CLINICAL DATA: 75-year-old male with central line placement.

EXAM:
PORTABLE CHEST 1 VIEW

[AP (1 of 2)]
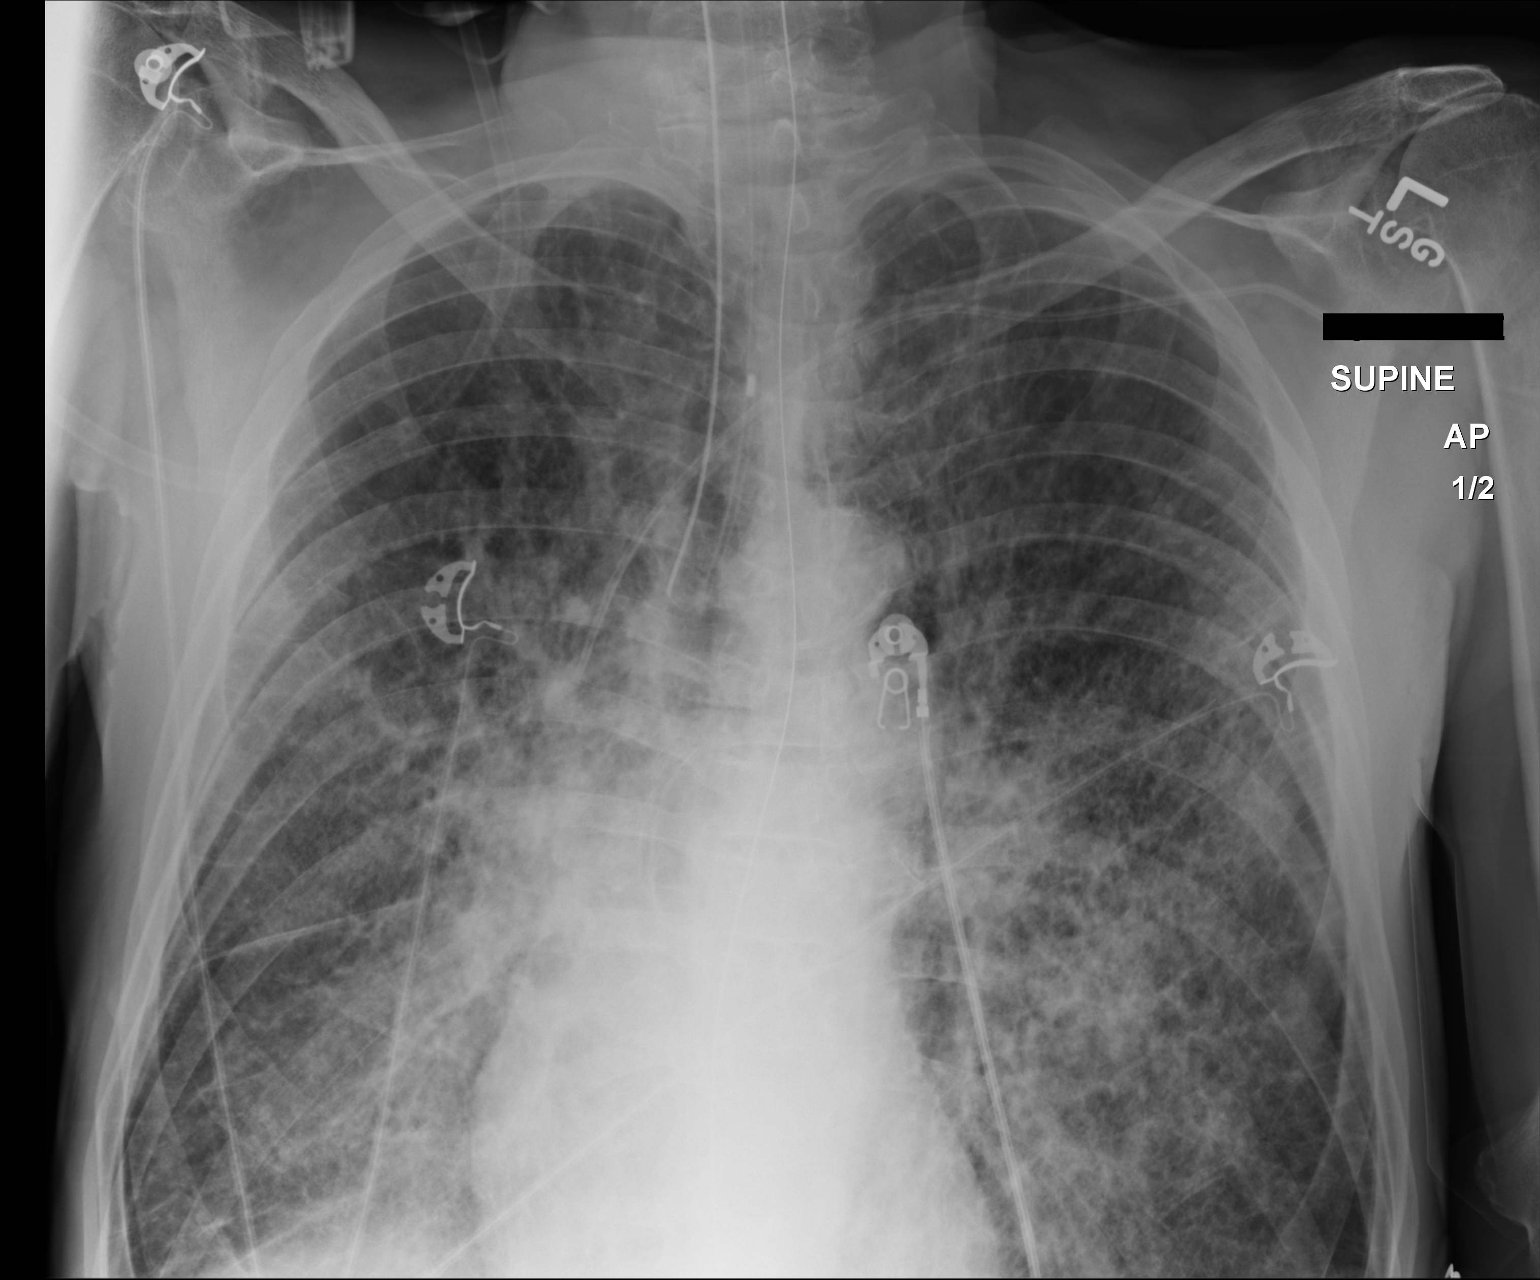

[AP (2 of 2)]
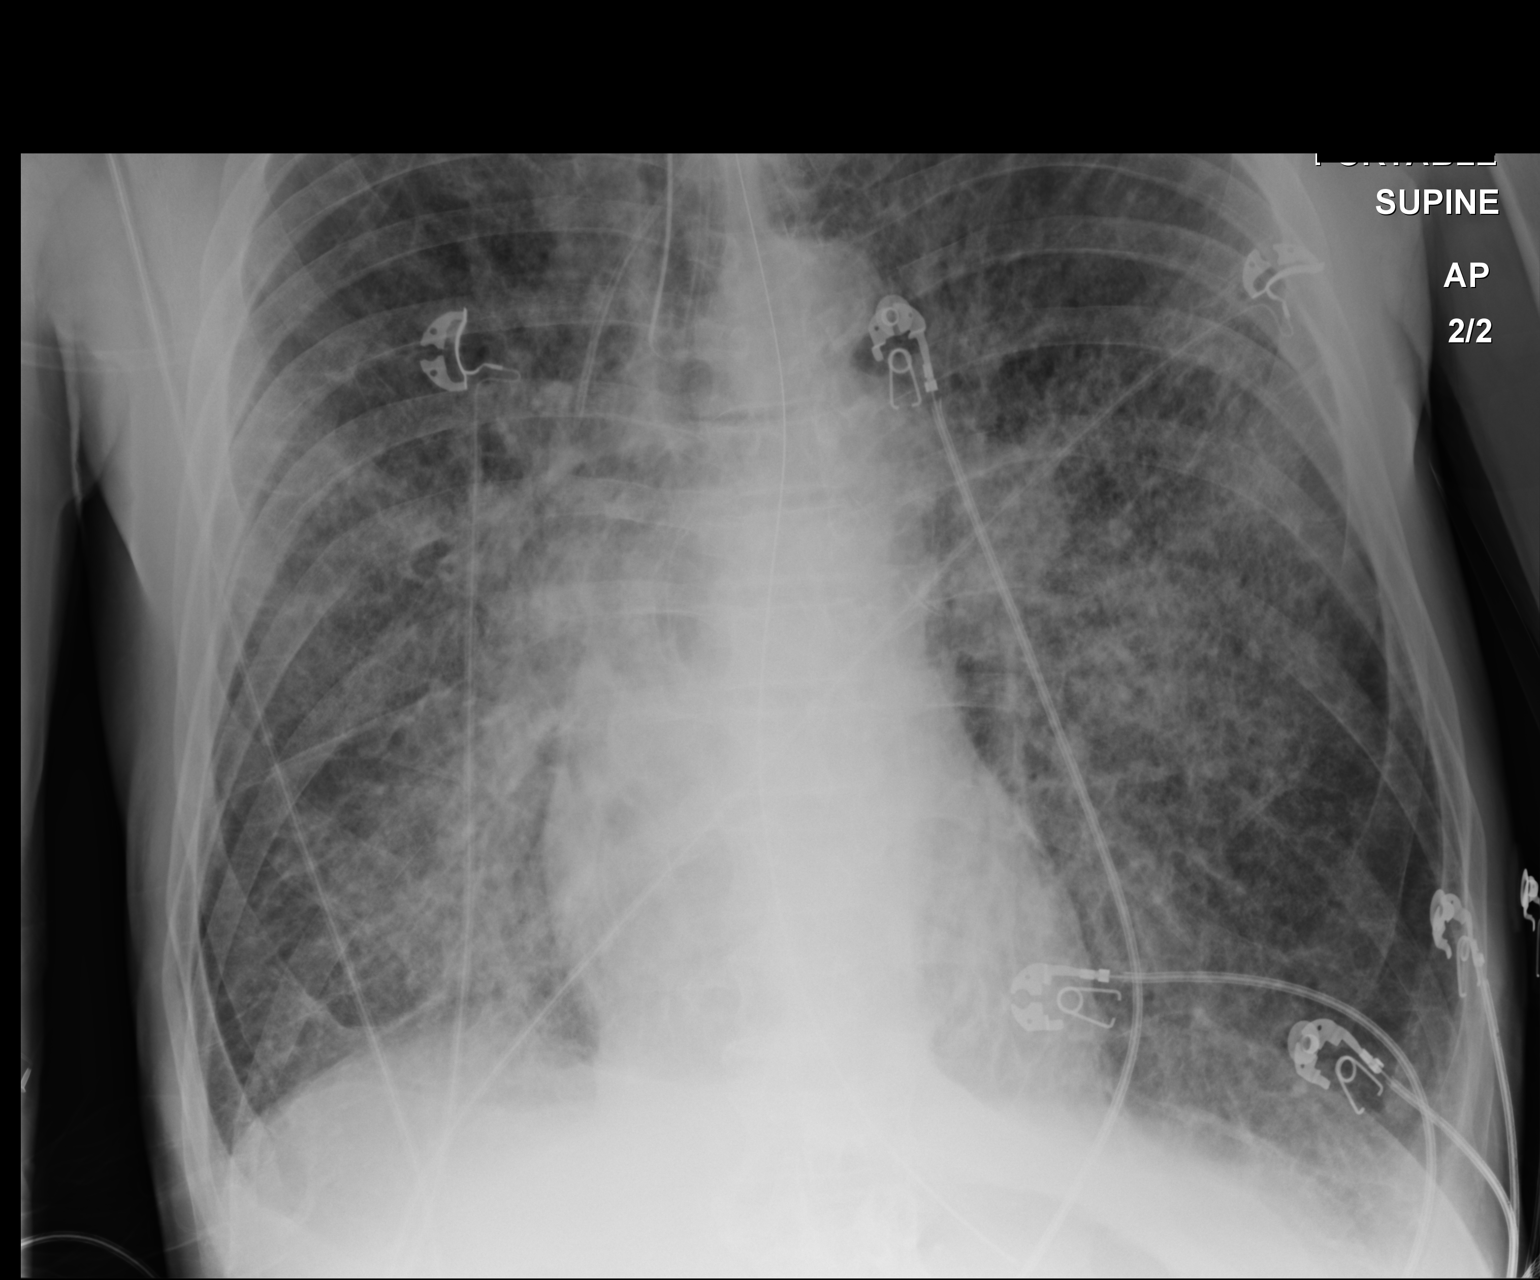

[2 of 2 positions shown; findings below may reference images not displayed]

FINDINGS: There has been interval placement of a left subclavian central line
with tip over central SVC. The endotracheal and enteric tubes are in
stable positioning. Emphysematous changes of the lungs with
bilateral patchy ground-glass densities similar to prior study. The
cardiac silhouette is within normal limits. No acute osseous
pathology.
IMPRESSION: Interval placement of a left subclavian central venous catheter with
tip over central SVC. No pneumothorax.

Other support devices in stable positioning.

Emphysema with bilateral patchy airspace densities as seen on the
prior study. Follow-up recommended.

## 2017-09-18 IMAGING — CR DG CHEST 1V PORT
2 series · 2 of 2 positions shown · non-contrast
Comparison: 03/31/2016 .  01/11/2015 .

CLINICAL DATA: Intubation.

EXAM:
PORTABLE CHEST 1 VIEW

[AP (1 of 2)]
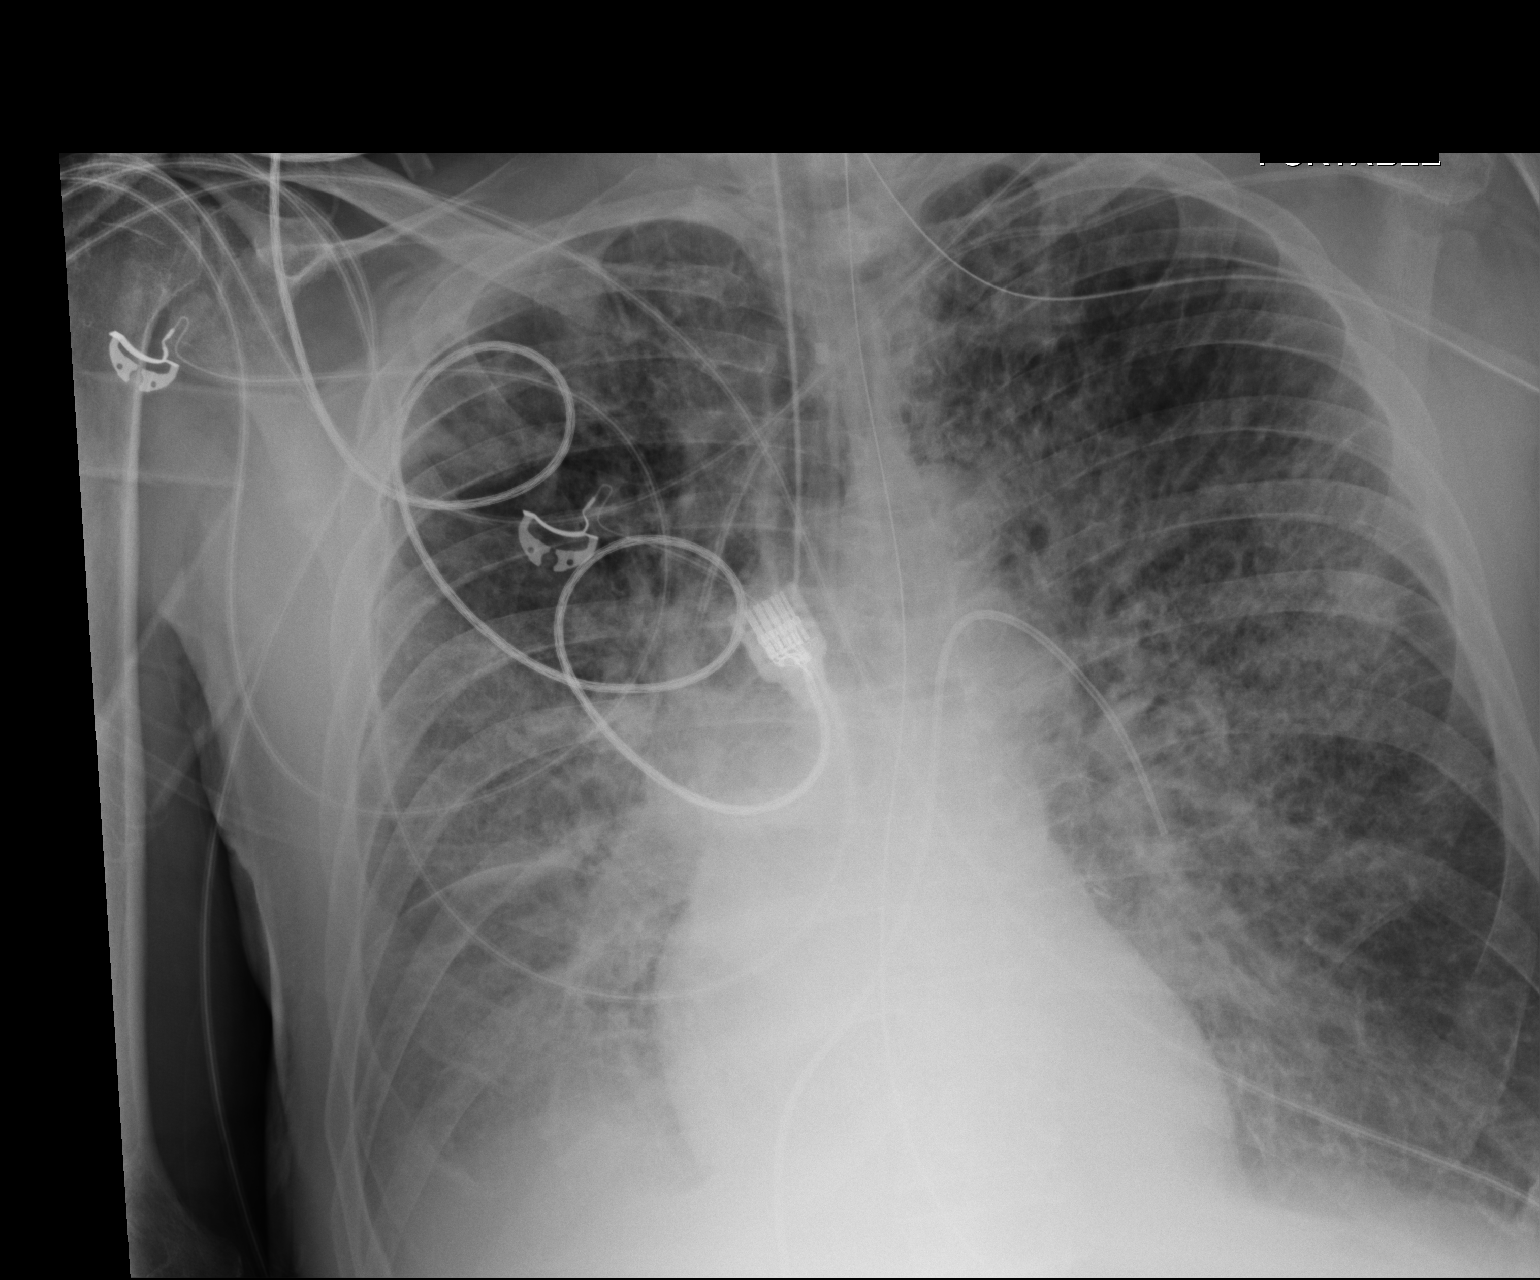

[AP (2 of 2)]
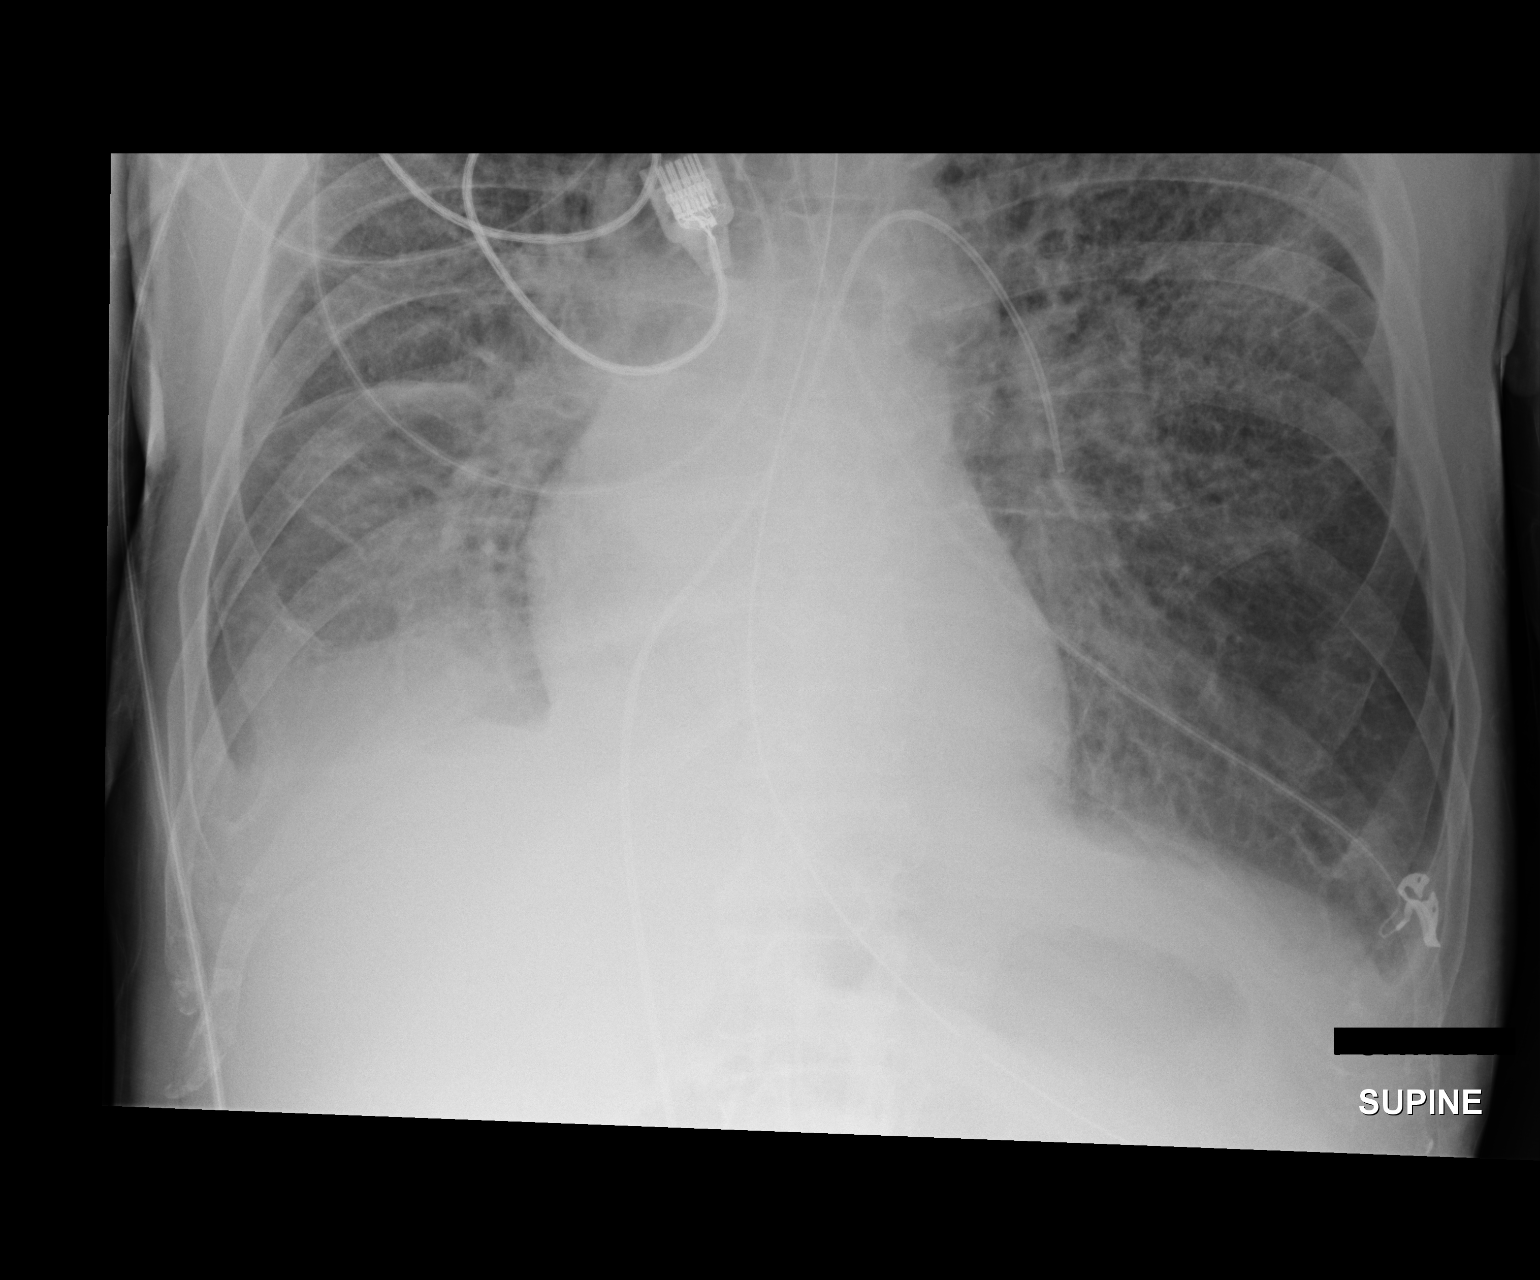

[2 of 2 positions shown; findings below may reference images not displayed]

FINDINGS: Endotracheal tube, NG tube, left subclavian line in stable position.
Left subclavian line in stable position . Left pulmonary artery
catheter noted with tip over the upper portion of the lower lobe
pulmonary artery . Heart size normal. Progressive diffuse bilateral
pulmonary infiltrates and/or edema. Small bilateral pleural
effusions. No pneumothorax.
IMPRESSION: 1. Left pulmonary artery catheter noted with tip over the upper
portion of the left lower low pulmonary artery. Remaining lines and
tubes in stable position.

2. Progressive bilateral pulmonary infiltrates/pulmonary edema.
Persistent bilateral pleural effusions, unchanged.
